# Patient Record
Sex: Female | Born: 1940 | Race: White | Hispanic: No | Marital: Single | Smoking: Never smoker
Health system: Southern US, Community
[De-identification: ages and names within clinical notes are randomized; demographics above are authoritative.]

## PROBLEM LIST (undated history)

## (undated) DIAGNOSIS — I10 Essential (primary) hypertension: Secondary | ICD-10-CM

## (undated) DIAGNOSIS — J302 Other seasonal allergic rhinitis: Secondary | ICD-10-CM

## (undated) DIAGNOSIS — R011 Cardiac murmur, unspecified: Secondary | ICD-10-CM

## (undated) DIAGNOSIS — M1990 Unspecified osteoarthritis, unspecified site: Secondary | ICD-10-CM

## (undated) DIAGNOSIS — G4730 Sleep apnea, unspecified: Secondary | ICD-10-CM

## (undated) DIAGNOSIS — H9190 Unspecified hearing loss, unspecified ear: Secondary | ICD-10-CM

## (undated) DIAGNOSIS — H8110 Benign paroxysmal vertigo, unspecified ear: Secondary | ICD-10-CM

## (undated) DIAGNOSIS — G629 Polyneuropathy, unspecified: Secondary | ICD-10-CM

## (undated) DIAGNOSIS — K449 Diaphragmatic hernia without obstruction or gangrene: Secondary | ICD-10-CM

## (undated) DIAGNOSIS — D131 Benign neoplasm of stomach: Secondary | ICD-10-CM

## (undated) DIAGNOSIS — M810 Age-related osteoporosis without current pathological fracture: Secondary | ICD-10-CM

## (undated) DIAGNOSIS — K209 Esophagitis, unspecified without bleeding: Secondary | ICD-10-CM

## (undated) DIAGNOSIS — J3089 Other allergic rhinitis: Secondary | ICD-10-CM

## (undated) DIAGNOSIS — R600 Localized edema: Secondary | ICD-10-CM

## (undated) DIAGNOSIS — K219 Gastro-esophageal reflux disease without esophagitis: Secondary | ICD-10-CM

## (undated) DIAGNOSIS — I6529 Occlusion and stenosis of unspecified carotid artery: Secondary | ICD-10-CM

## (undated) HISTORY — PX: EAR EXAMINATION UNDER ANESTHESIA: SHX1482

## (undated) HISTORY — PX: EYE SURGERY: SHX253

## (undated) HISTORY — PX: ABDOMINAL HYSTERECTOMY: SHX81

## (undated) HISTORY — PX: BREAST CYST ASPIRATION: SHX578

## (undated) HISTORY — PX: OTHER SURGICAL HISTORY: SHX169

## (undated) HISTORY — PX: INNER EAR SURGERY: SHX679

---

## 2003-10-15 ENCOUNTER — Other Ambulatory Visit: Payer: Self-pay

## 2004-05-05 ENCOUNTER — Ambulatory Visit: Payer: Self-pay | Admitting: Internal Medicine

## 2004-10-28 ENCOUNTER — Ambulatory Visit: Payer: Self-pay

## 2005-05-19 ENCOUNTER — Ambulatory Visit: Payer: Self-pay | Admitting: Internal Medicine

## 2005-09-27 ENCOUNTER — Ambulatory Visit: Payer: Self-pay | Admitting: Internal Medicine

## 2005-12-29 ENCOUNTER — Ambulatory Visit: Payer: Self-pay | Admitting: Gastroenterology

## 2006-01-11 ENCOUNTER — Ambulatory Visit: Payer: Self-pay | Admitting: Gastroenterology

## 2006-06-07 ENCOUNTER — Ambulatory Visit: Payer: Self-pay | Admitting: Internal Medicine

## 2007-07-04 ENCOUNTER — Ambulatory Visit: Payer: Self-pay | Admitting: Internal Medicine

## 2007-11-08 ENCOUNTER — Ambulatory Visit: Payer: Self-pay | Admitting: Podiatry

## 2008-07-10 ENCOUNTER — Ambulatory Visit: Payer: Self-pay | Admitting: Internal Medicine

## 2009-01-14 ENCOUNTER — Ambulatory Visit: Payer: Self-pay | Admitting: Gastroenterology

## 2009-07-14 ENCOUNTER — Ambulatory Visit: Payer: Self-pay | Admitting: Internal Medicine

## 2010-07-15 ENCOUNTER — Ambulatory Visit: Payer: Self-pay | Admitting: Internal Medicine

## 2011-07-27 ENCOUNTER — Ambulatory Visit: Payer: Self-pay | Admitting: Gastroenterology

## 2011-08-12 ENCOUNTER — Ambulatory Visit: Payer: Self-pay | Admitting: Internal Medicine

## 2011-12-18 ENCOUNTER — Emergency Department: Payer: Self-pay | Admitting: Emergency Medicine

## 2011-12-19 LAB — CBC
HCT: 36.2 % (ref 35.0–47.0)
MCH: 28.5 pg (ref 26.0–34.0)
MCHC: 33.8 g/dL (ref 32.0–36.0)
Platelet: 260 10*3/uL (ref 150–440)
RDW: 14.1 % (ref 11.5–14.5)
WBC: 6.2 10*3/uL (ref 3.6–11.0)

## 2011-12-19 LAB — COMPREHENSIVE METABOLIC PANEL
Anion Gap: 6 — ABNORMAL LOW (ref 7–16)
BUN: 16 mg/dL (ref 7–18)
Calcium, Total: 8.9 mg/dL (ref 8.5–10.1)
Co2: 33 mmol/L — ABNORMAL HIGH (ref 21–32)
EGFR (Non-African Amer.): >60
Glucose: 94 mg/dL (ref 65–99)
Osmolality: 265 (ref 275–301)
Potassium: 4 mmol/L (ref 3.5–5.1)
Sodium: 132 mmol/L — ABNORMAL LOW (ref 136–145)
Total Protein: 8.4 g/dL — ABNORMAL HIGH (ref 6.4–8.2)

## 2011-12-19 LAB — TROPONIN I: Troponin-I: <0.02 ng/mL

## 2011-12-20 ENCOUNTER — Ambulatory Visit: Payer: Self-pay | Admitting: Internal Medicine

## 2012-02-11 ENCOUNTER — Ambulatory Visit: Payer: Self-pay | Admitting: Internal Medicine

## 2012-08-18 ENCOUNTER — Ambulatory Visit: Payer: Self-pay | Admitting: Internal Medicine

## 2012-10-05 ENCOUNTER — Ambulatory Visit: Payer: Self-pay | Admitting: Internal Medicine

## 2013-03-02 ENCOUNTER — Emergency Department: Payer: Self-pay | Admitting: Emergency Medicine

## 2013-03-02 LAB — URINALYSIS, COMPLETE
Bilirubin,UR: NEGATIVE
Glucose,UR: NEGATIVE mg/dL (ref 0–75)
Ketone: NEGATIVE
Leukocyte Esterase: NEGATIVE
Ph: 7 (ref 4.5–8.0)
RBC,UR: 2 /HPF (ref 0–5)
Specific Gravity: 1.009 (ref 1.003–1.030)
Squamous Epithelial: 3
WBC UR: <1 /HPF (ref 0–5)

## 2013-03-02 LAB — LIPID PANEL
Cholesterol: 185 mg/dL (ref 0–200)
HDL Cholesterol: 102 mg/dL — ABNORMAL HIGH (ref 40–60)
Triglycerides: 67 mg/dL (ref 0–200)
VLDL Cholesterol, Calc: 13 mg/dL (ref 5–40)

## 2013-03-02 LAB — COMPREHENSIVE METABOLIC PANEL
Alkaline Phosphatase: 131 U/L (ref 50–136)
Anion Gap: 6 — ABNORMAL LOW (ref 7–16)
BUN: 15 mg/dL (ref 7–18)
Bilirubin,Total: 0.2 mg/dL (ref 0.2–1.0)
Calcium, Total: 8.3 mg/dL — ABNORMAL LOW (ref 8.5–10.1)
Chloride: 98 mmol/L (ref 98–107)
Co2: 26 mmol/L (ref 21–32)
Creatinine: 0.7 mg/dL (ref 0.60–1.30)
EGFR (African American): >60
Glucose: 107 mg/dL — ABNORMAL HIGH (ref 65–99)
Potassium: 3.8 mmol/L (ref 3.5–5.1)
SGOT(AST): 28 U/L (ref 15–37)
SGPT (ALT): 29 U/L (ref 12–78)
Sodium: 130 mmol/L — ABNORMAL LOW (ref 136–145)
Total Protein: 7.8 g/dL (ref 6.4–8.2)

## 2013-03-02 LAB — CBC WITH DIFFERENTIAL/PLATELET
Comment - H1-Com1: NORMAL
HCT: 43.9 % (ref 35.0–47.0)
HGB: 14.4 g/dL (ref 12.0–16.0)
Lymphocytes: 18 %
MCHC: 32.8 g/dL (ref 32.0–36.0)
MCV: 90 fL (ref 80–100)
Platelet: 211 10*3/uL (ref 150–440)
RBC: 4.88 10*6/uL (ref 3.80–5.20)
Segmented Neutrophils: 76 %
WBC: 8.4 10*3/uL (ref 3.6–11.0)

## 2013-03-02 LAB — TROPONIN I: Troponin-I: <0.02 ng/mL

## 2013-08-16 DIAGNOSIS — I10 Essential (primary) hypertension: Secondary | ICD-10-CM | POA: Insufficient documentation

## 2013-08-21 ENCOUNTER — Ambulatory Visit: Payer: Self-pay | Admitting: Internal Medicine

## 2013-12-26 ENCOUNTER — Ambulatory Visit: Payer: Self-pay | Admitting: Internal Medicine

## 2014-04-12 DIAGNOSIS — G5793 Unspecified mononeuropathy of bilateral lower limbs: Secondary | ICD-10-CM | POA: Insufficient documentation

## 2014-04-12 DIAGNOSIS — J302 Other seasonal allergic rhinitis: Secondary | ICD-10-CM | POA: Insufficient documentation

## 2014-04-12 DIAGNOSIS — Z8669 Personal history of other diseases of the nervous system and sense organs: Secondary | ICD-10-CM | POA: Insufficient documentation

## 2014-04-23 DIAGNOSIS — E782 Mixed hyperlipidemia: Secondary | ICD-10-CM | POA: Insufficient documentation

## 2014-08-12 DIAGNOSIS — M159 Polyosteoarthritis, unspecified: Secondary | ICD-10-CM | POA: Insufficient documentation

## 2014-08-12 DIAGNOSIS — M791 Myalgia, unspecified site: Secondary | ICD-10-CM | POA: Insufficient documentation

## 2014-08-23 ENCOUNTER — Ambulatory Visit: Payer: Self-pay | Admitting: Internal Medicine

## 2014-11-27 ENCOUNTER — Other Ambulatory Visit: Payer: Self-pay | Admitting: Unknown Physician Specialty

## 2014-11-27 DIAGNOSIS — H908 Mixed conductive and sensorineural hearing loss, unspecified: Secondary | ICD-10-CM

## 2014-12-06 ENCOUNTER — Ambulatory Visit
Admission: RE | Admit: 2014-12-06 | Discharge: 2014-12-06 | Disposition: A | Discharge status: Home / Self Care | Payer: BLUE CROSS/BLUE SHIELD | Source: Ambulatory Visit | Attending: Unknown Physician Specialty | Admitting: Unknown Physician Specialty

## 2014-12-06 DIAGNOSIS — H908 Mixed conductive and sensorineural hearing loss, unspecified: Secondary | ICD-10-CM | POA: Insufficient documentation

## 2014-12-06 DIAGNOSIS — H903 Sensorineural hearing loss, bilateral: Secondary | ICD-10-CM | POA: Diagnosis present

## 2014-12-06 DIAGNOSIS — J329 Chronic sinusitis, unspecified: Secondary | ICD-10-CM | POA: Diagnosis not present

## 2015-01-27 DIAGNOSIS — R9389 Abnormal findings on diagnostic imaging of other specified body structures: Secondary | ICD-10-CM | POA: Insufficient documentation

## 2015-08-15 ENCOUNTER — Other Ambulatory Visit: Payer: Self-pay | Admitting: Internal Medicine

## 2015-08-15 DIAGNOSIS — Z1231 Encounter for screening mammogram for malignant neoplasm of breast: Secondary | ICD-10-CM

## 2015-08-25 ENCOUNTER — Ambulatory Visit
Admission: RE | Admit: 2015-08-25 | Discharge: 2015-08-25 | Disposition: A | Discharge status: Home / Self Care | Payer: 59 | Source: Ambulatory Visit | Attending: Internal Medicine | Admitting: Internal Medicine

## 2015-08-25 DIAGNOSIS — Z1231 Encounter for screening mammogram for malignant neoplasm of breast: Secondary | ICD-10-CM

## 2016-03-30 DIAGNOSIS — R32 Unspecified urinary incontinence: Secondary | ICD-10-CM | POA: Insufficient documentation

## 2016-03-30 DIAGNOSIS — K2100 Gastro-esophageal reflux disease with esophagitis, without bleeding: Secondary | ICD-10-CM | POA: Insufficient documentation

## 2016-06-18 ENCOUNTER — Ambulatory Visit
Admission: RE | Admit: 2016-06-18 | Discharge: 2016-06-18 | Disposition: A | Discharge status: Home / Self Care | Payer: 59 | Source: Ambulatory Visit | Attending: Gastroenterology | Admitting: Gastroenterology

## 2016-06-18 ENCOUNTER — Ambulatory Visit: Payer: 59 | Admitting: Anesthesiology

## 2016-06-18 ENCOUNTER — Encounter
Admission: RE | Disposition: A | Discharge status: Home / Self Care | Payer: Self-pay | Source: Ambulatory Visit | Attending: Gastroenterology

## 2016-06-18 ENCOUNTER — Encounter: Payer: Self-pay | Admitting: *Deleted

## 2016-06-18 DIAGNOSIS — K317 Polyp of stomach and duodenum: Secondary | ICD-10-CM | POA: Insufficient documentation

## 2016-06-18 DIAGNOSIS — Z86010 Personal history of colonic polyps: Secondary | ICD-10-CM | POA: Diagnosis not present

## 2016-06-18 DIAGNOSIS — K228 Other specified diseases of esophagus: Secondary | ICD-10-CM | POA: Insufficient documentation

## 2016-06-18 DIAGNOSIS — K2950 Unspecified chronic gastritis without bleeding: Secondary | ICD-10-CM | POA: Diagnosis not present

## 2016-06-18 DIAGNOSIS — K2970 Gastritis, unspecified, without bleeding: Secondary | ICD-10-CM | POA: Insufficient documentation

## 2016-06-18 DIAGNOSIS — Z1211 Encounter for screening for malignant neoplasm of colon: Secondary | ICD-10-CM | POA: Diagnosis not present

## 2016-06-18 DIAGNOSIS — Z888 Allergy status to other drugs, medicaments and biological substances status: Secondary | ICD-10-CM | POA: Diagnosis not present

## 2016-06-18 DIAGNOSIS — I10 Essential (primary) hypertension: Secondary | ICD-10-CM | POA: Insufficient documentation

## 2016-06-18 DIAGNOSIS — Q438 Other specified congenital malformations of intestine: Secondary | ICD-10-CM | POA: Diagnosis not present

## 2016-06-18 DIAGNOSIS — K210 Gastro-esophageal reflux disease with esophagitis: Secondary | ICD-10-CM | POA: Insufficient documentation

## 2016-06-18 DIAGNOSIS — G4730 Sleep apnea, unspecified: Secondary | ICD-10-CM | POA: Diagnosis not present

## 2016-06-18 DIAGNOSIS — Z886 Allergy status to analgesic agent status: Secondary | ICD-10-CM | POA: Insufficient documentation

## 2016-06-18 DIAGNOSIS — Z882 Allergy status to sulfonamides status: Secondary | ICD-10-CM | POA: Diagnosis not present

## 2016-06-18 HISTORY — PX: ESOPHAGOGASTRODUODENOSCOPY (EGD) WITH PROPOFOL: SHX5813

## 2016-06-18 HISTORY — PX: COLONOSCOPY WITH PROPOFOL: SHX5780

## 2016-06-18 SURGERY — COLONOSCOPY WITH PROPOFOL
Anesthesia: General

## 2016-06-18 MED ORDER — SODIUM CHLORIDE 0.9 % IV SOLN: INTRAVENOUS | Status: DC

## 2016-06-18 MED ORDER — PHENYLEPHRINE 40 MCG/ML (10ML) SYRINGE FOR IV PUSH (FOR BLOOD PRESSURE SUPPORT)
PREFILLED_SYRINGE | INTRAVENOUS | Status: AC
  Filled 2016-06-18: qty 10

## 2016-06-18 MED ORDER — PROPOFOL 500 MG/50ML IV EMUL
INTRAVENOUS | Status: DC | PRN
  Administered 2016-06-18: 150 ug/kg/min via INTRAVENOUS

## 2016-06-18 MED ORDER — MIDAZOLAM HCL 2 MG/2ML IJ SOLN
INTRAMUSCULAR | Status: DC | PRN
  Administered 2016-06-18: 1 mg via INTRAVENOUS

## 2016-06-18 MED ORDER — LIDOCAINE HCL (PF) 2 % IJ SOLN
INTRAMUSCULAR | Status: AC
  Filled 2016-06-18: qty 2

## 2016-06-18 MED ORDER — SODIUM CHLORIDE 0.9 % IV SOLN
INTRAVENOUS | Status: DC
  Administered 2016-06-18: 10:00:00 via INTRAVENOUS
  Administered 2016-06-18: 1000 mL via INTRAVENOUS

## 2016-06-18 MED ORDER — PHENYLEPHRINE HCL 10 MG/ML IJ SOLN
INTRAMUSCULAR | Status: DC | PRN
  Administered 2016-06-18 (×3): 120 ug via INTRAVENOUS
  Administered 2016-06-18: 40 ug via INTRAVENOUS

## 2016-06-18 MED ORDER — LIDOCAINE HCL (CARDIAC) 20 MG/ML IV SOLN
INTRAVENOUS | Status: DC | PRN
  Administered 2016-06-18: 60 mg via INTRAVENOUS

## 2016-06-18 MED ORDER — PROPOFOL 500 MG/50ML IV EMUL
INTRAVENOUS | Status: AC
  Filled 2016-06-18: qty 50

## 2016-06-18 MED ORDER — PROPOFOL 10 MG/ML IV BOLUS
INTRAVENOUS | Status: DC | PRN
  Administered 2016-06-18: 50 mg via INTRAVENOUS

## 2016-06-18 MED ORDER — PROPOFOL 10 MG/ML IV BOLUS
INTRAVENOUS | Status: AC
Start: 2016-06-18 — End: 2016-06-18
  Filled 2016-06-18: qty 20

## 2016-06-18 MED ORDER — MIDAZOLAM HCL 2 MG/2ML IJ SOLN
INTRAMUSCULAR | Status: AC
  Filled 2016-06-18: qty 2

## 2016-06-18 NOTE — Anesthesia Postprocedure Evaluation (Signed)
Anesthesia Post Note  Patient: Seraphim Mangelsdorf Misch  Procedure(s) Performed: Procedure(s) (LRB): COLONOSCOPY WITH PROPOFOL (N/A) ESOPHAGOGASTRODUODENOSCOPY (EGD) WITH PROPOFOL (N/A)  Patient location during evaluation: Endoscopy Anesthesia Type: General Level of consciousness: awake and alert Pain management: pain level controlled Vital Signs Assessment: post-procedure vital signs reviewed and stable Respiratory status: spontaneous breathing, nonlabored ventilation, respiratory function stable and patient connected to nasal cannula oxygen Cardiovascular status: blood pressure returned to baseline and stable Postop Assessment: no signs of nausea or vomiting Anesthetic complications: no     Last Vitals:  Vitals:   06/18/16 1050 06/18/16 1100  BP: (!) 118/58 122/67  Pulse: 76 65  Resp: 16 18  Temp:      Last Pain:  Vitals:   06/18/16 1020  TempSrc: Tympanic                 Precious Haws Tadhg Eskew

## 2016-06-18 NOTE — Anesthesia Post-op Follow-up Note (Cosign Needed)
Anesthesia QCDR form completed.        

## 2016-06-18 NOTE — H&P (Signed)
Outpatient short stay form Pre-procedure 06/18/2016 7:55 AM Lollie Sails MD  Primary Physician: Dr Tracie Harrier  Reason for visit:  EGD and colonoscopy  History of present illness:  Patient is a 76 year old female presenting today as above. She is been having some increased symptoms of GERD recently this sounds a bit perhaps also like esophageal dysmotility. She also has a personal history of adenomatous colon polyps. She tolerated her prep well. She takes no aspirin or blood thinning agents.    Current Facility-Administered Medications:  .  0.9 %  sodium chloride infusion, , Intravenous, Continuous, Lollie Sails, MD, Last Rate: 20 mL/hr at 06/18/16 0752, 1,000 mL at 06/18/16 0752 .  0.9 %  sodium chloride infusion, , Intravenous, Continuous, Lollie Sails, MD  Prescriptions Prior to Admission  Medication Sig Dispense Refill Last Dose  . acetaminophen (TYLENOL) 500 MG chewable tablet Chew 500 mg by mouth every 6 (six) hours as needed for pain.   Past Week at Unknown time  . calcium carbonate (OS-CAL) 600 MG TABS tablet Take 600 mg by mouth 2 (two) times daily with a meal.   Past Week at Unknown time  . Clindamycin Phosphate foam Apply topically 2 (two) times daily.   06/17/2016 at Unknown time  . clotrimazole-betamethasone (LOTRISONE) cream Apply 1 application topically 2 (two) times daily.   06/17/2016 at Unknown time  . cyanocobalamin 1000 MCG tablet Take 1,000 mcg by mouth daily.   06/17/2016 at Unknown time  . fluticasone (FLONASE) 50 MCG/ACT nasal spray Place into both nostrils daily.   06/17/2016 at Unknown time  . gabapentin (NEURONTIN) 100 MG capsule Take 100 mg by mouth 3 (three) times daily.   06/17/2016 at Unknown time  . hydrochlorothiazide (MICROZIDE) 12.5 MG capsule Take 12.5 mg by mouth daily.   06/18/2016 at 0530  . loratadine (CLARITIN) 10 MG tablet Take 10 mg by mouth daily.   06/17/2016 at Unknown time  . losartan (COZAAR) 100 MG tablet Take 100 mg by mouth  daily.   06/18/2016 at 0530  . metoprolol (LOPRESSOR) 100 MG tablet Take 100 mg by mouth 2 (two) times daily.   06/18/2016 at 0530  . omeprazole (PRILOSEC) 20 MG capsule Take 20 mg by mouth daily.   06/17/2016 at Unknown time  . spironolactone (ALDACTONE) 25 MG tablet Take 25 mg by mouth daily.   06/17/2016 at Unknown time     Allergies  Allergen Reactions  . Aspirin   . Augmentin [Amoxicillin-Pot Clavulanate] Diarrhea  . Compazine [Prochlorperazine Edisylate]   . Ibuprofen   . Triamterene   . Sulfa Antibiotics Rash     No past medical history on file.  Review of systems:      Physical Exam    Heart and lungs: Regular rate and rhythm without rub or gallop, lungs are bilaterally clear.    HEENT: Normocephalic atraumatic eyes are anicteric    Other:     Pertinant exam for procedure: Soft nontender nondistended bowel sounds positive normoactive.    Planned proceedures: EGD and Colonoscopy and indicated procedures. I have discussed the risks benefits and complications of procedures to include not limited to bleeding, infection, perforation and the risk of sedation and the patient wishes to proceed.    Lollie Sails, MD Gastroenterology 06/18/2016  7:55 AM

## 2016-06-18 NOTE — Op Note (Signed)
[facility] Gastroenterology Patient Name: Kaitlyn Nelson Procedure Date: 06/18/2016 9:33 AM MRN: [id number] Account #: [id number] Date of Birth: 12/23/1940 Admit Type: Outpatient Age: 76 Room: [facility] ENDO ROOM 3 Gender: Female Note Status: Finalized Procedure:            Colonoscopy Indications:          Personal history of colonic polyps Providers:            Lollie Sails, MD Referring MD:         Tracie Harrier, MD (Referring MD) Medicines:            Monitored Anesthesia Care Complications:        No immediate complications. Procedure:            Pre-Anesthesia Assessment:                       - ASA Grade Assessment: III - A patient with severe                        systemic disease.                       After obtaining informed consent, the colonoscope was                        passed under direct vision. Throughout the procedure,                        the patient's blood pressure, pulse, and oxygen                        saturations were monitored continuously. The                        Colonoscope was introduced through the anus and                        advanced to the the cecum, identified by appendiceal                        orifice and ileocecal valve. The colonoscopy was                        performed with moderate difficulty due to significant                        looping and a tortuous colon. Successful completion of                        the procedure was aided by using manual pressure. The                        quality of the bowel preparation was good. Findings:      The sigmoid colon, descending colon, transverse colon and ascending       colon were significantly tortuous.      The digital rectal exam was normal.      The retroflexed view of the distal rectum and anal verge was normal and       showed no anal or rectal abnormalities.      Biopsies for histology were  taken with a cold forceps from the right       colon and  left colon for evaluation of microscopic colitis. Impression:           - Tortuous colon.                       - The distal rectum and anal verge are normal on                        retroflexion view.                       - Biopsies were taken with a cold forceps from the                        right colon and left colon for evaluation of                        microscopic colitis. Recommendation:       - Discharge patient to home.                       - Return to GI clinic in 4 weeks. Procedure Code(s):    --- Professional ---                       [id number], Colonoscopy, flexible; with biopsy, single or                        multiple Diagnosis Code(s):    --- Professional ---                       Z86.010, Personal history of colonic polyps                       Q43.8, Other specified congenital malformations of                        intestine CPT copyright 2016 American Medical Association. All rights reserved. The codes documented in this report are preliminary and upon coder review may  be revised to meet current compliance requirements. Lollie Sails, MD 06/18/2016 10:21:00 AM This report has been signed electronically. Number of Addenda: 0 Note Initiated On: 06/18/2016 9:33 AM Scope Withdrawal Time: 0 hours 6 minutes 55 seconds  Total Procedure Duration: 0 hours 17 minutes 50 seconds       [facility]

## 2016-06-18 NOTE — Transfer of Care (Signed)
Immediate Anesthesia Transfer of Care Note  Patient: Denine Plouff Sanks  Procedure(s) Performed: Procedure(s): COLONOSCOPY WITH PROPOFOL (N/A) ESOPHAGOGASTRODUODENOSCOPY (EGD) WITH PROPOFOL (N/A)  Patient Location: Endoscopy Unit  Anesthesia Type:General  Level of Consciousness: awake, oriented and patient cooperative  Airway & Oxygen Therapy: Patient Spontanous Breathing and Patient connected to nasal cannula oxygen  Post-op Assessment: Report given to RN, Post -op Vital signs reviewed and stable and Patient moving all extremities X 4  Post vital signs: Reviewed and stable  Last Vitals:  Vitals:   06/18/16 0740  BP: (!) 159/59  Pulse: 60  Resp: 16  Temp: (!) 35.3 C    Last Pain:  Vitals:   06/18/16 0740  TempSrc: Tympanic         Complications: No apparent anesthesia complications

## 2016-06-18 NOTE — Anesthesia Preprocedure Evaluation (Signed)
Anesthesia Evaluation  Patient identified by MRN, date of birth, ID band Patient awake    Reviewed: Allergy & Precautions, H&P , NPO status , Patient's Chart, lab work & pertinent test results  History of Anesthesia Complications Negative for: history of anesthetic complications  Airway Mallampati: III  TM Distance: <3 FB Neck ROM: limited    Dental  (+) Poor Dentition, Chipped, Caps   Pulmonary neg shortness of breath, sleep apnea ,    Pulmonary exam normal breath sounds clear to auscultation       Cardiovascular Exercise Tolerance: Good hypertension, (-) angina(-) Past MI and (-) DOE Normal cardiovascular exam Rhythm:regular Rate:Normal     Neuro/Psych negative neurological ROS  negative psych ROS   GI/Hepatic Neg liver ROS, GERD  Controlled and Medicated,  Endo/Other  negative endocrine ROS  Renal/GU negative Renal ROS  negative genitourinary   Musculoskeletal   Abdominal   Peds  Hematology negative hematology ROS (+)   Anesthesia Other Findings  Past Surgical History: No date: BREAST CYST ASPIRATION Right     Comment: neg  BMI    Body Mass Index:  33.73 kg/m      Reproductive/Obstetrics negative OB ROS                             Anesthesia Physical Anesthesia Plan  ASA: III  Anesthesia Plan: General   Post-op Pain Management:    Induction:   Airway Management Planned:   Additional Equipment:   Intra-op Plan:   Post-operative Plan:   Informed Consent: I have reviewed the patients History and Physical, chart, labs and discussed the procedure including the risks, benefits and alternatives for the proposed anesthesia with the patient or authorized representative who has indicated his/her understanding and acceptance.   Dental Advisory Given  Plan Discussed with: Anesthesiologist, CRNA and Surgeon  Anesthesia Plan Comments:         Anesthesia Quick  Evaluation

## 2016-06-18 NOTE — Op Note (Signed)
[facility] Gastroenterology Patient Name: Kaitlyn Nelson Procedure Date: 06/18/2016 9:34 AM MRN: [id number] Account #: [id number] Date of Birth: February 13, 1941 Admit Type: Outpatient Age: 76 Room: [facility] ENDO ROOM 3 Gender: Female Note Status: Finalized Procedure:            Upper GI endoscopy Indications:          Dyspepsia, Gastro-esophageal reflux disease Providers:            Lollie Sails, MD Referring MD:         Tracie Harrier, MD (Referring MD) Medicines:            Monitored Anesthesia Care Complications:        No immediate complications. Procedure:            Pre-Anesthesia Assessment:                       - ASA Grade Assessment: III - A patient with severe                        systemic disease.                       After obtaining informed consent, the endoscope was                        passed under direct vision. Throughout the procedure,                        the patient's blood pressure, pulse, and oxygen                        saturations were monitored continuously. The Endoscope                        was introduced through the mouth, and advanced to the                        third part of duodenum. The total duration of the                        procedure was [Duration]. The upper GI endoscopy was                        accomplished without difficulty. The patient tolerated                        the procedure well. Findings:      The Z-line was variable. Biopsies were taken with a cold forceps for       histology.      Multiple 1 to 3 mm pedunculated and sessile polyps with no bleeding and       no stigmata of recent bleeding were found in the gastric body. Biopsies       were taken with a cold forceps for histology.      A single 4 mm submucosal/mucosal papule (nodule) with no bleeding and no       stigmata of recent bleeding was found on the greater curvature of the       gastric antrum. Biopsies were taken with a cold forceps  for histology.      Diffuse minimal inflammation characterized  by erythema was found in the       gastric body. Biopsies were taken with a cold forceps for histology.      The cardia and gastric fundus were normal on retroflexion. Unable to       insufflate adequately to evaluate fundus fully.      The examined duodenum was normal.      A small hiatal hernia was found. The Z-line was a variable distance from       incisors; the hiatal hernia was sliding. Impression:           - Z-line variable. Biopsied.                       - Multiple gastric polyps. Biopsied.                       - A single submucosal papule (nodule) found in the                        stomach. Biopsied.                       - Gastritis. Biopsied.                       - Normal examined duodenum. Recommendation:       - Discharge patient to home.                       - Perform a colonoscopy today.                       - Continue present medications.                       - Await pathology results.                       - Do an upper GI series at appointment to be scheduled. Procedure Code(s):    --- Professional ---                       [phone], Esophagogastroduodenoscopy, flexible, transoral;                        with biopsy, single or multiple Diagnosis Code(s):    --- Professional ---                       K22.8, Other specified diseases of esophagus                       K31.7, Polyp of stomach and duodenum                       K31.89, Other diseases of stomach and duodenum                       K29.70, Gastritis, unspecified, without bleeding                       R10.13, Epigastric pain                       K21.9, Gastro-esophageal reflux disease without  esophagitis CPT copyright 2016 American Medical Association. All rights reserved. The codes documented in this report are preliminary and upon coder review may  be revised to meet current compliance requirements. Lollie Sails, MD 06/18/2016 9:56:53 AM This report has been signed electronically. Number of Addenda: 0 Note Initiated On: 06/18/2016 9:34 AM      [facility]

## 2016-06-21 ENCOUNTER — Encounter: Payer: Self-pay | Admitting: Gastroenterology

## 2016-06-21 LAB — SURGICAL PATHOLOGY

## 2016-07-28 ENCOUNTER — Other Ambulatory Visit: Payer: Self-pay | Admitting: Internal Medicine

## 2016-07-28 DIAGNOSIS — Z1231 Encounter for screening mammogram for malignant neoplasm of breast: Secondary | ICD-10-CM

## 2016-08-26 ENCOUNTER — Ambulatory Visit
Admission: RE | Admit: 2016-08-26 | Discharge: 2016-08-26 | Disposition: A | Discharge status: Home / Self Care | Payer: 59 | Source: Ambulatory Visit | Attending: Internal Medicine | Admitting: Internal Medicine

## 2016-08-26 DIAGNOSIS — Z1231 Encounter for screening mammogram for malignant neoplasm of breast: Secondary | ICD-10-CM | POA: Insufficient documentation

## 2016-08-26 DIAGNOSIS — R928 Other abnormal and inconclusive findings on diagnostic imaging of breast: Secondary | ICD-10-CM | POA: Insufficient documentation

## 2016-08-31 ENCOUNTER — Other Ambulatory Visit: Payer: Self-pay | Admitting: Internal Medicine

## 2016-08-31 DIAGNOSIS — N6489 Other specified disorders of breast: Secondary | ICD-10-CM

## 2016-08-31 DIAGNOSIS — R928 Other abnormal and inconclusive findings on diagnostic imaging of breast: Secondary | ICD-10-CM

## 2016-09-07 ENCOUNTER — Ambulatory Visit
Admission: RE | Admit: 2016-09-07 | Discharge: 2016-09-07 | Disposition: A | Discharge status: Home / Self Care | Payer: 59 | Source: Ambulatory Visit | Attending: Internal Medicine | Admitting: Internal Medicine

## 2016-09-07 DIAGNOSIS — N6489 Other specified disorders of breast: Secondary | ICD-10-CM

## 2016-09-07 DIAGNOSIS — R928 Other abnormal and inconclusive findings on diagnostic imaging of breast: Secondary | ICD-10-CM

## 2017-08-01 ENCOUNTER — Other Ambulatory Visit: Payer: Self-pay | Admitting: Internal Medicine

## 2017-08-01 DIAGNOSIS — Z1239 Encounter for other screening for malignant neoplasm of breast: Secondary | ICD-10-CM

## 2017-10-03 ENCOUNTER — Ambulatory Visit
Admission: RE | Admit: 2017-10-03 | Discharge: 2017-10-03 | Disposition: A | Discharge status: Home / Self Care | Payer: 59 | Source: Ambulatory Visit | Attending: Internal Medicine | Admitting: Internal Medicine

## 2017-10-03 DIAGNOSIS — Z1231 Encounter for screening mammogram for malignant neoplasm of breast: Secondary | ICD-10-CM | POA: Insufficient documentation

## 2017-10-03 DIAGNOSIS — Z1239 Encounter for other screening for malignant neoplasm of breast: Secondary | ICD-10-CM

## 2018-06-19 ENCOUNTER — Encounter: Payer: Self-pay | Admitting: *Deleted

## 2018-06-20 ENCOUNTER — Ambulatory Visit: Payer: 59 | Admitting: Anesthesiology

## 2018-06-20 ENCOUNTER — Ambulatory Visit
Admission: RE | Admit: 2018-06-20 | Discharge: 2018-06-20 | Disposition: A | Discharge status: Home / Self Care | Payer: 59 | Attending: Ophthalmology | Admitting: Ophthalmology

## 2018-06-20 ENCOUNTER — Other Ambulatory Visit: Payer: Self-pay

## 2018-06-20 ENCOUNTER — Encounter
Admission: RE | Disposition: A | Discharge status: Home / Self Care | Payer: Self-pay | Source: Home / Self Care | Attending: Ophthalmology

## 2018-06-20 ENCOUNTER — Encounter: Payer: Self-pay | Admitting: *Deleted

## 2018-06-20 DIAGNOSIS — Z79899 Other long term (current) drug therapy: Secondary | ICD-10-CM | POA: Diagnosis not present

## 2018-06-20 DIAGNOSIS — K219 Gastro-esophageal reflux disease without esophagitis: Secondary | ICD-10-CM | POA: Insufficient documentation

## 2018-06-20 DIAGNOSIS — G4730 Sleep apnea, unspecified: Secondary | ICD-10-CM | POA: Insufficient documentation

## 2018-06-20 DIAGNOSIS — H2512 Age-related nuclear cataract, left eye: Secondary | ICD-10-CM | POA: Insufficient documentation

## 2018-06-20 DIAGNOSIS — I10 Essential (primary) hypertension: Secondary | ICD-10-CM | POA: Diagnosis not present

## 2018-06-20 HISTORY — DX: Sleep apnea, unspecified: G47.30

## 2018-06-20 HISTORY — DX: Polyneuropathy, unspecified: G62.9

## 2018-06-20 HISTORY — DX: Age-related osteoporosis without current pathological fracture: M81.0

## 2018-06-20 HISTORY — DX: Diaphragmatic hernia without obstruction or gangrene: K44.9

## 2018-06-20 HISTORY — DX: Gastro-esophageal reflux disease without esophagitis: K21.9

## 2018-06-20 HISTORY — DX: Occlusion and stenosis of unspecified carotid artery: I65.29

## 2018-06-20 HISTORY — DX: Other allergic rhinitis: J30.89

## 2018-06-20 HISTORY — DX: Localized edema: R60.0

## 2018-06-20 HISTORY — PX: CATARACT EXTRACTION W/PHACO: SHX586

## 2018-06-20 HISTORY — DX: Essential (primary) hypertension: I10

## 2018-06-20 HISTORY — DX: Unspecified hearing loss, unspecified ear: H91.90

## 2018-06-20 HISTORY — DX: Unspecified osteoarthritis, unspecified site: M19.90

## 2018-06-20 SURGERY — PHACOEMULSIFICATION, CATARACT, WITH IOL INSERTION
Anesthesia: Monitor Anesthesia Care | Site: Eye | Laterality: Left

## 2018-06-20 MED ORDER — FENTANYL CITRATE (PF) 100 MCG/2ML IJ SOLN
INTRAMUSCULAR | Status: AC
  Filled 2018-06-20: qty 2

## 2018-06-20 MED ORDER — SODIUM CHLORIDE 0.9 % IV SOLN
INTRAVENOUS | Status: DC
  Administered 2018-06-20: 12:00:00 via INTRAVENOUS

## 2018-06-20 MED ORDER — LIDOCAINE HCL (PF) 4 % IJ SOLN
INTRAMUSCULAR | Status: AC
  Filled 2018-06-20: qty 5

## 2018-06-20 MED ORDER — MOXIFLOXACIN HCL 0.5 % OP SOLN
OPHTHALMIC | Status: AC
  Administered 2018-06-20: 1 [drp] via OPHTHALMIC
  Filled 2018-06-20: qty 6

## 2018-06-20 MED ORDER — NA HYALUR & NA CHOND-NA HYALUR 0.55-0.5 ML IO KIT
PACK | INTRAOCULAR | Status: AC
  Filled 2018-06-20: qty 1.05

## 2018-06-20 MED ORDER — MOXIFLOXACIN HCL 0.5 % OP SOLN: 1.0000 [drp] | OPHTHALMIC | Status: DC | PRN

## 2018-06-20 MED ORDER — FENTANYL CITRATE (PF) 100 MCG/2ML IJ SOLN
INTRAMUSCULAR | Status: DC | PRN
  Administered 2018-06-20: 25 ug via INTRAVENOUS

## 2018-06-20 MED ORDER — EPINEPHRINE PF 1 MG/ML IJ SOLN
INTRAOCULAR | Status: DC | PRN
  Administered 2018-06-20: 12:00:00 via OPHTHALMIC

## 2018-06-20 MED ORDER — EPINEPHRINE PF 1 MG/ML IJ SOLN
INTRAMUSCULAR | Status: AC
  Filled 2018-06-20: qty 2

## 2018-06-20 MED ORDER — NA HYALUR & NA CHOND-NA HYALUR 0.4-0.35 ML IO KIT
PACK | INTRAOCULAR | Status: DC | PRN
  Administered 2018-06-20: .35 mL via INTRAOCULAR

## 2018-06-20 MED ORDER — TETRACAINE HCL 0.5 % OP SOLN
OPHTHALMIC | Status: AC
  Administered 2018-06-20: 1 [drp] via OPHTHALMIC
  Filled 2018-06-20: qty 4

## 2018-06-20 MED ORDER — NEOMYCIN-POLYMYXIN-DEXAMETH 3.5-10000-0.1 OP OINT
TOPICAL_OINTMENT | OPHTHALMIC | Status: AC
  Filled 2018-06-20: qty 3.5

## 2018-06-20 MED ORDER — NEOMYCIN-POLYMYXIN-DEXAMETH 0.1 % OP OINT
TOPICAL_OINTMENT | OPHTHALMIC | Status: DC | PRN
  Administered 2018-06-20: 1 via OPHTHALMIC

## 2018-06-20 MED ORDER — CARBACHOL 0.01 % IO SOLN
INTRAOCULAR | Status: DC | PRN
  Administered 2018-06-20: 0.5 mL via INTRAOCULAR

## 2018-06-20 MED ORDER — MOXIFLOXACIN HCL 0.5 % OP SOLN
1.0000 [drp] | OPHTHALMIC | Status: AC
  Administered 2018-06-20 (×3): 1 [drp] via OPHTHALMIC

## 2018-06-20 MED ORDER — ARMC OPHTHALMIC DILATING DROPS
OPHTHALMIC | Status: AC
  Administered 2018-06-20: 1 via OPHTHALMIC
  Filled 2018-06-20: qty 0.5

## 2018-06-20 MED ORDER — TETRACAINE HCL 0.5 % OP SOLN
1.0000 [drp] | OPHTHALMIC | Status: AC
  Administered 2018-06-20 (×3): 1 [drp] via OPHTHALMIC

## 2018-06-20 MED ORDER — LIDOCAINE HCL (PF) 4 % IJ SOLN
INTRAOCULAR | Status: DC | PRN
  Administered 2018-06-20: 4 mL via OPHTHALMIC

## 2018-06-20 MED ORDER — POVIDONE-IODINE 5 % OP SOLN
OPHTHALMIC | Status: AC
  Filled 2018-06-20: qty 30

## 2018-06-20 MED ORDER — MIDAZOLAM HCL 2 MG/2ML IJ SOLN
INTRAMUSCULAR | Status: DC | PRN
  Administered 2018-06-20: 1 mg via INTRAVENOUS

## 2018-06-20 MED ORDER — POVIDONE-IODINE 5 % OP SOLN
OPHTHALMIC | Status: DC | PRN
  Administered 2018-06-20: 1 via OPHTHALMIC

## 2018-06-20 MED ORDER — ARMC OPHTHALMIC DILATING DROPS
1.0000 "application " | OPHTHALMIC | Status: AC
  Administered 2018-06-20 (×3): 1 via OPHTHALMIC

## 2018-06-20 MED ORDER — MIDAZOLAM HCL 2 MG/2ML IJ SOLN
INTRAMUSCULAR | Status: AC
  Filled 2018-06-20: qty 2

## 2018-06-20 SURGICAL SUPPLY — 18 items
GLOVE BIO SURGEON STRL SZ8 (GLOVE) ×2 IMPLANT
GLOVE BIOGEL M 6.5 STRL (GLOVE) ×2 IMPLANT
GLOVE SURG LX 7.5 STRW (GLOVE) ×1
GLOVE SURG LX STRL 7.5 STRW (GLOVE) ×1 IMPLANT
GOWN STRL REUS W/ TWL LRG LVL3 (GOWN DISPOSABLE) ×2 IMPLANT
GOWN STRL REUS W/TWL LRG LVL3 (GOWN DISPOSABLE) ×2
LABEL CATARACT MEDS ST (LABEL) ×2 IMPLANT
LENS IOL TECNIS ITEC 16.0 (Intraocular Lens) ×2 IMPLANT
NDL HPO THNWL 1X22GA REG BVL (NEEDLE) ×1 IMPLANT
NEEDLE SAFETY 22GX1 (NEEDLE) ×1
PACK CATARACT (MISCELLANEOUS) ×2 IMPLANT
PACK CATARACT BRASINGTON LX (MISCELLANEOUS) ×2 IMPLANT
PACK EYE AFTER SURG (MISCELLANEOUS) ×2 IMPLANT
SOL BSS BAG (MISCELLANEOUS) ×2
SOLUTION BSS BAG (MISCELLANEOUS) ×1 IMPLANT
SYR 5ML LL (SYRINGE) ×2 IMPLANT
WATER STERILE IRR 250ML POUR (IV SOLUTION) ×2 IMPLANT
WIPE NON LINTING 3.25X3.25 (MISCELLANEOUS) ×2 IMPLANT

## 2018-06-20 NOTE — Anesthesia Postprocedure Evaluation (Signed)
Anesthesia Post Note  Patient: Chatara Lucente Cleaver  Procedure(s) Performed: CATARACT EXTRACTION PHACO AND INTRAOCULAR LENS PLACEMENT ([place]) LEFT (Left Eye)  Patient location during evaluation: PACU Anesthesia Type: MAC Level of consciousness: awake and alert and oriented Pain management: pain level controlled Vital Signs Assessment: post-procedure vital signs reviewed and stable Respiratory status: spontaneous breathing, nonlabored ventilation and respiratory function stable Cardiovascular status: blood pressure returned to baseline and stable Postop Assessment: no signs of nausea or vomiting Anesthetic complications: no     Last Vitals:  Vitals:   06/20/18 1007 06/20/18 1209  BP: 126/74 (!) 106/48  Pulse: 61 (!) 58  Resp: 14 16  Temp: (!) 35.8 C 36.6 C  SpO2: 99% 98%    Last Pain:  Vitals:   06/20/18 1209  TempSrc: Temporal  PainSc: 0-No pain                 Henri Baumler

## 2018-06-20 NOTE — Transfer of Care (Signed)
Immediate Anesthesia Transfer of Care Note  Patient: Kaitlyn Nelson  Procedure(s) Performed: CATARACT EXTRACTION PHACO AND INTRAOCULAR LENS PLACEMENT ([facility]) LEFT (Left Eye)  Patient Location: PACU  Anesthesia Type:MAC  Level of Consciousness: awake, alert  and oriented  Airway & Oxygen Therapy: Patient Spontanous Breathing  Post-op Assessment: Report given to RN and Post -op Vital signs reviewed and stable  Post vital signs: Reviewed and stable  Last Vitals:  Vitals Value Taken Time  BP 106/48 06/20/2018 12:09 PM  Temp 36.6 C 06/20/2018 12:09 PM  Pulse 58 06/20/2018 12:09 PM  Resp 16 06/20/2018 12:09 PM  SpO2 98 % 06/20/2018 12:09 PM    Last Pain:  Vitals:   06/20/18 1209  TempSrc: Temporal  PainSc: 0-No pain         Complications: No apparent anesthesia complications

## 2018-06-20 NOTE — Anesthesia Post-op Follow-up Note (Signed)
Anesthesia QCDR form completed.        

## 2018-06-20 NOTE — Discharge Instructions (Signed)
Eye Surgery Discharge Instructions    Expect mild scratchy sensation or mild soreness. DO NOT RUB YOUR EYE!  The day of surgery:  Minimal physical activity, but bed rest is not required  No reading, computer work, or close hand work  No bending, lifting, or straining.  May watch TV  For 24 hours:  No driving, legal decisions, or alcoholic beverages  Safety precautions  Eat anything you prefer: It is better to start with liquids, then soup then solid foods.  _____ Eye patch should be worn until postoperative exam tomorrow.  ____ Solar shield eyeglasses should be worn for comfort in the sunlight/patch while sleeping  Resume all regular medications including aspirin or Coumadin if these were discontinued prior to surgery. You may shower, bathe, shave, or wash your hair. Tylenol may be taken for mild discomfort.  Call your doctor if you experience significant pain, nausea, or vomiting, fever > 101 or other signs of infection. [phone] or [phone] Specific instructions:  Follow-up Information    Leandrew Koyanagi, MD Follow up on 06/20/2018.   Specialty:  Ophthalmology Why:  at 2:55pm Contact information: [address]   [place] [address] [phone]

## 2018-06-20 NOTE — H&P (Signed)

## 2018-06-20 NOTE — Anesthesia Preprocedure Evaluation (Signed)
Anesthesia Evaluation  Patient identified by MRN, date of birth, ID band Patient awake    Reviewed: Allergy & Precautions, NPO status , Patient's Chart, lab work & pertinent test results  History of Anesthesia Complications Negative for: history of anesthetic complications  Airway Mallampati: II  TM Distance: <3 FB Neck ROM: Full    Dental  (+) Missing   Pulmonary sleep apnea and Continuous Positive Airway Pressure Ventilation , neg COPD,    breath sounds clear to auscultation- rhonchi (-) wheezing      Cardiovascular Exercise Tolerance: Good hypertension, Pt. on medications (-) CAD, (-) Past MI, (-) Cardiac Stents and (-) CABG  Rhythm:Regular Rate:Normal - Systolic murmurs and - Diastolic murmurs    Neuro/Psych neg Seizures negative neurological ROS  negative psych ROS   GI/Hepatic Neg liver ROS, hiatal hernia, GERD  ,  Endo/Other  negative endocrine ROSneg diabetes  Renal/GU negative Renal ROS     Musculoskeletal  (+) Arthritis ,   Abdominal (+) - obese,   Peds  Hematology negative hematology ROS (+)   Anesthesia Other Findings Past Medical History: No date: Arthritis No date: Carotid artery stenosis No date: Environmental and seasonal allergies No date: GERD (gastroesophageal reflux disease) No date: HH (hiatus hernia) No date: HOH (hard of hearing) No date: Hypertension No date: Lower extremity edema No date: Neuropathy No date: Osteoporosis No date: Sleep apnea   Reproductive/Obstetrics                             Anesthesia Physical Anesthesia Plan  ASA: II  Anesthesia Plan: MAC   Post-op Pain Management:    Induction: Intravenous  PONV Risk Score and Plan: 2 and Midazolam  Airway Management Planned: Natural Airway  Additional Equipment:   Intra-op Plan:   Post-operative Plan:   Informed Consent: I have reviewed the patients History and Physical, chart, labs  and discussed the procedure including the risks, benefits and alternatives for the proposed anesthesia with the patient or authorized representative who has indicated his/her understanding and acceptance.       Plan Discussed with: CRNA and Anesthesiologist  Anesthesia Plan Comments:         Anesthesia Quick Evaluation

## 2018-06-20 NOTE — Op Note (Signed)
OPERATIVE NOTE  Kaitlyn Nelson [id number] 06/20/2018   PREOPERATIVE DIAGNOSIS:  Nuclear sclerotic cataract left eye. H25.12   POSTOPERATIVE DIAGNOSIS:    Nuclear sclerotic cataract left eye.     PROCEDURE:  Phacoemusification with posterior chamber intraocular lens placement of the left eye   LENS:   Implant Name Type Inv. Item Serial No. Manufacturer Lot No. LRB No. Used  LENS IOL DIOP 16.0 - [id number] Intraocular Lens LENS IOL DIOP 16.0 [id number] AMO  Left 1        ULTRASOUND TIME: 12 % of 0 minutes, 48 seconds.  CDE 3.2   SURGEON:  Wyonia Hough, MD   ANESTHESIA:  Topical with tetracaine drops and 2% Xylocaine jelly, augmented with 1% preservative-free intracameral lidocaine.    COMPLICATIONS:  None.   DESCRIPTION OF PROCEDURE:  The patient was identified in the holding room and transported to the operating room and placed in the supine position under the operating microscope.  The left eye was identified as the operative eye and it was prepped and draped in the usual sterile ophthalmic fashion.   A 1 millimeter clear-corneal paracentesis was made at the 1:30 position. 0.5 ml of preservative-free 1% lidocaine was injected into the anterior chamber.  The anterior chamber was filled with Viscoat viscoelastic.  A 2.4 millimeter keratome was used to make a near-clear corneal incision at the 10:30 position.  .  A curvilinear capsulorrhexis was made with a cystotome and capsulorrhexis forceps.  Balanced salt solution was used to hydrodissect and hydrodelineate the nucleus.   Phacoemulsification was then used in stop and chop fashion to remove the lens nucleus and epinucleus.  The remaining cortex was then removed using the irrigation and aspiration handpiece. Provisc was then placed into the capsular bag to distend it for lens placement.  A lens was then injected into the capsular bag.  The remaining viscoelastic was aspirated.   Wounds were hydrated with balanced  salt solution.  The anterior chamber was inflated to a physiologic pressure with balanced salt solution. Vigamox 0.2 ml of a 1mg  per ml solution was injected into the anterior chamber for a dose of 0.2 mg of intracameral antibiotic at the completion of the case.  Miostat was placed into the anterior chamber to constrict the pupil.  No wound leaks were noted.  Topical Vigamox drops and Maxitrol ointment were applied to the eye.  The patient was taken to the recovery room in stable condition without complications of anesthesia or surgery  Jaeline Whobrey 06/20/2018, 12:07 PM

## 2018-06-21 ENCOUNTER — Ambulatory Visit: Admit: 2018-06-21 | Payer: Medicare Other | Admitting: Ophthalmology

## 2018-06-21 SURGERY — PHACOEMULSIFICATION, CATARACT, WITH IOL INSERTION
Anesthesia: Topical | Laterality: Left

## 2018-07-02 ENCOUNTER — Emergency Department: Payer: 59

## 2018-07-02 ENCOUNTER — Emergency Department
Admission: EM | Admit: 2018-07-02 | Discharge: 2018-07-02 | Payer: 59 | Attending: Emergency Medicine | Admitting: Emergency Medicine

## 2018-07-02 ENCOUNTER — Other Ambulatory Visit: Payer: Self-pay

## 2018-07-02 DIAGNOSIS — R109 Unspecified abdominal pain: Secondary | ICD-10-CM | POA: Insufficient documentation

## 2018-07-02 DIAGNOSIS — Y92410 Unspecified street and highway as the place of occurrence of the external cause: Secondary | ICD-10-CM | POA: Diagnosis not present

## 2018-07-02 DIAGNOSIS — R51 Headache: Secondary | ICD-10-CM | POA: Diagnosis not present

## 2018-07-02 DIAGNOSIS — T1490XA Injury, unspecified, initial encounter: Secondary | ICD-10-CM

## 2018-07-02 DIAGNOSIS — S299XXA Unspecified injury of thorax, initial encounter: Secondary | ICD-10-CM | POA: Diagnosis present

## 2018-07-02 DIAGNOSIS — M25531 Pain in right wrist: Secondary | ICD-10-CM | POA: Diagnosis not present

## 2018-07-02 DIAGNOSIS — S2243XA Multiple fractures of ribs, bilateral, initial encounter for closed fracture: Secondary | ICD-10-CM | POA: Insufficient documentation

## 2018-07-02 DIAGNOSIS — Z79899 Other long term (current) drug therapy: Secondary | ICD-10-CM | POA: Insufficient documentation

## 2018-07-02 DIAGNOSIS — Y9389 Activity, other specified: Secondary | ICD-10-CM | POA: Diagnosis not present

## 2018-07-02 DIAGNOSIS — M79606 Pain in leg, unspecified: Secondary | ICD-10-CM | POA: Diagnosis not present

## 2018-07-02 DIAGNOSIS — I10 Essential (primary) hypertension: Secondary | ICD-10-CM | POA: Insufficient documentation

## 2018-07-02 DIAGNOSIS — Y999 Unspecified external cause status: Secondary | ICD-10-CM | POA: Diagnosis not present

## 2018-07-02 LAB — CBC WITH DIFFERENTIAL/PLATELET
Abs Immature Granulocytes: 0.07 10*3/uL (ref 0.00–0.07)
Basophils Absolute: 0 10*3/uL (ref 0.0–0.1)
Basophils Relative: 0 %
Eosinophils Absolute: 0.1 10*3/uL (ref 0.0–0.5)
Eosinophils Relative: 0 %
HEMATOCRIT: 42.8 % (ref 36.0–46.0)
Hemoglobin: 13.6 g/dL (ref 12.0–15.0)
Immature Granulocytes: 1 %
LYMPHS ABS: 1.2 10*3/uL (ref 0.7–4.0)
Lymphocytes Relative: 8 %
MCH: 29.4 pg (ref 26.0–34.0)
MCHC: 31.8 g/dL (ref 30.0–36.0)
MCV: 92.6 fL (ref 80.0–100.0)
Monocytes Absolute: 0.9 10*3/uL (ref 0.1–1.0)
Monocytes Relative: 6 %
Neutro Abs: 12.7 10*3/uL — ABNORMAL HIGH (ref 1.7–7.7)
Neutrophils Relative %: 85 %
Platelets: 226 10*3/uL (ref 150–400)
RBC: 4.62 MIL/uL (ref 3.87–5.11)
RDW: 13.7 % (ref 11.5–15.5)
WBC: 15 10*3/uL — ABNORMAL HIGH (ref 4.0–10.5)
nRBC: 0 % (ref 0.0–0.2)

## 2018-07-02 LAB — BASIC METABOLIC PANEL
Anion gap: 10 (ref 5–15)
Anion gap: 9 (ref 5–15)
BUN: 23 mg/dL (ref 8–23)
BUN: 23 mg/dL (ref 8–23)
CALCIUM: 9 mg/dL (ref 8.9–10.3)
CO2: 24 mmol/L (ref 22–32)
CO2: 26 mmol/L (ref 22–32)
Calcium: 8.9 mg/dL (ref 8.9–10.3)
Chloride: 103 mmol/L (ref 98–111)
Chloride: 100 mmol/L (ref 98–111)
Creatinine, Ser: 0.66 mg/dL (ref 0.44–1.00)
Creatinine, Ser: 0.61 mg/dL (ref 0.44–1.00)
GFR calc Af Amer: >60 mL/min (ref >60)
GFR calc Af Amer: >60 mL/min (ref >60)
GFR calc non Af Amer: >60 mL/min (ref >60)
GFR calc non Af Amer: >60 mL/min (ref >60)
GLUCOSE: 139 mg/dL — AB (ref 70–99)
Glucose, Bld: 119 mg/dL — ABNORMAL HIGH (ref 70–99)
Potassium: 6.2 mmol/L — ABNORMAL HIGH (ref 3.5–5.1)
Potassium: 4.1 mmol/L (ref 3.5–5.1)
Sodium: 137 mmol/L (ref 135–145)
Sodium: 135 mmol/L (ref 135–145)

## 2018-07-02 MED ORDER — FENTANYL CITRATE (PF) 100 MCG/2ML IJ SOLN
50.0000 ug | Freq: Once | INTRAMUSCULAR | Status: AC
  Administered 2018-07-02: 50 ug via INTRAVENOUS
  Filled 2018-07-02: qty 2

## 2018-07-02 MED ORDER — ACETAMINOPHEN 325 MG PO TABS
650.0000 mg | ORAL_TABLET | Freq: Once | ORAL | Status: AC
  Administered 2018-07-02: 650 mg via ORAL
  Filled 2018-07-02: qty 2

## 2018-07-02 MED ORDER — IOPAMIDOL (ISOVUE-300) INJECTION 61%
100.0000 mL | Freq: Once | INTRAVENOUS | Status: AC | PRN
  Administered 2018-07-02: 100 mL via INTRAVENOUS
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
## 2018-07-02 NOTE — ED Notes (Signed)
Pt has c-collar present

## 2018-07-03 MED ORDER — GENERIC EXTERNAL MEDICATION
2.00 | Status: DC
Start: ? — End: 2018-07-03

## 2018-07-03 MED ORDER — LIDOCAINE 5 % EX PTCH
2.00 | MEDICATED_PATCH | CUTANEOUS | Status: DC
Start: 2018-07-04 — End: 2018-07-03

## 2018-07-03 MED ORDER — GENERIC EXTERNAL MEDICATION
15.00 | Status: DC
Start: ? — End: 2018-07-03

## 2018-07-03 MED ORDER — DOCUSATE SODIUM 100 MG PO CAPS
100.00 | ORAL_CAPSULE | ORAL | Status: DC
Start: 2018-07-03 — End: 2018-07-03

## 2018-07-03 MED ORDER — GENERIC EXTERNAL MEDICATION
30.00 | Status: DC
Start: ? — End: 2018-07-03

## 2018-07-03 MED ORDER — POLYETHYLENE GLYCOL 3350 17 G PO PACK
17.00 | PACK | ORAL | Status: DC
Start: 2018-07-04 — End: 2018-07-03

## 2018-07-03 MED ORDER — PREDNISOLONE ACETATE 1 % OP SUSP
1.00 | OPHTHALMIC | Status: DC
Start: 2018-07-03 — End: 2018-07-03

## 2018-07-03 MED ORDER — GENERIC EXTERNAL MEDICATION
4.00 | Status: DC
Start: ? — End: 2018-07-03

## 2018-07-03 MED ORDER — POTASSIUM CHLORIDE 10 MEQ/100ML IV SOLN
10.00 | INTRAVENOUS | Status: DC
Start: ? — End: 2018-07-03

## 2018-07-03 MED ORDER — GENERIC EXTERNAL MEDICATION
5.00 | Status: DC
Start: ? — End: 2018-07-03

## 2018-07-03 MED ORDER — HYDROMORPHONE HCL 1 MG/ML IJ SOLN
0.20 | INTRAMUSCULAR | Status: DC
Start: ? — End: 2018-07-03

## 2018-07-03 MED ORDER — GENERIC EXTERNAL MEDICATION
1.00 | Status: DC
Start: ? — End: 2018-07-03

## 2018-07-03 MED ORDER — GENERIC EXTERNAL MEDICATION
40.00 | Status: DC
Start: 2018-07-04 — End: 2018-07-03

## 2018-07-03 MED ORDER — MAGNESIUM SULFATE 2 GM/50ML IV SOLN
2.00 | INTRAVENOUS | Status: DC
Start: ? — End: 2018-07-03

## 2018-07-03 MED ORDER — ACETAMINOPHEN 325 MG PO TABS
650.00 | ORAL_TABLET | ORAL | Status: DC
Start: 2018-07-03 — End: 2018-07-03

## 2018-07-03 MED ORDER — IBUPROFEN 400 MG PO TABS
400.00 | ORAL_TABLET | ORAL | Status: DC
Start: 2018-07-03 — End: 2018-07-03

## 2018-07-03 MED ORDER — METOPROLOL TARTRATE 25 MG PO TABS
75.00 | ORAL_TABLET | ORAL | Status: DC
Start: 2018-07-03 — End: 2018-07-03

## 2018-07-03 MED ORDER — GABAPENTIN 100 MG PO CAPS
100.00 | ORAL_CAPSULE | ORAL | Status: DC
Start: 2018-07-03 — End: 2018-07-03

## 2018-07-10 DIAGNOSIS — E669 Obesity, unspecified: Secondary | ICD-10-CM | POA: Insufficient documentation

## 2018-07-10 DIAGNOSIS — S2249XA Multiple fractures of ribs, unspecified side, initial encounter for closed fracture: Secondary | ICD-10-CM | POA: Insufficient documentation

## 2018-07-10 DIAGNOSIS — S2220XA Unspecified fracture of sternum, initial encounter for closed fracture: Secondary | ICD-10-CM | POA: Insufficient documentation

## 2018-07-25 ENCOUNTER — Other Ambulatory Visit: Payer: Self-pay | Admitting: Internal Medicine

## 2018-07-25 DIAGNOSIS — R600 Localized edema: Secondary | ICD-10-CM

## 2018-07-26 ENCOUNTER — Ambulatory Visit
Admission: RE | Admit: 2018-07-26 | Discharge: 2018-07-26 | Disposition: A | Discharge status: Home / Self Care | Payer: 59 | Source: Ambulatory Visit | Attending: Internal Medicine | Admitting: Internal Medicine

## 2018-07-26 DIAGNOSIS — R600 Localized edema: Secondary | ICD-10-CM | POA: Insufficient documentation

## 2018-09-06 ENCOUNTER — Ambulatory Visit: Admission: RE | Admit: 2018-09-06 | Payer: 59 | Source: Home / Self Care | Admitting: Ophthalmology

## 2018-09-06 ENCOUNTER — Encounter: Admission: RE | Payer: Self-pay | Source: Home / Self Care

## 2018-09-06 SURGERY — PHACOEMULSIFICATION, CATARACT, WITH IOL INSERTION
Anesthesia: Choice | Laterality: Right

## 2018-09-13 ENCOUNTER — Other Ambulatory Visit: Payer: Self-pay | Admitting: Internal Medicine

## 2018-09-13 ENCOUNTER — Other Ambulatory Visit (HOSPITAL_COMMUNITY): Payer: Self-pay | Admitting: Internal Medicine

## 2018-09-13 ENCOUNTER — Ambulatory Visit: Payer: Medicare Other

## 2018-09-13 DIAGNOSIS — Z1239 Encounter for other screening for malignant neoplasm of breast: Secondary | ICD-10-CM

## 2018-09-13 DIAGNOSIS — Z1231 Encounter for screening mammogram for malignant neoplasm of breast: Secondary | ICD-10-CM

## 2018-09-13 DIAGNOSIS — M7989 Other specified soft tissue disorders: Secondary | ICD-10-CM

## 2018-09-14 ENCOUNTER — Ambulatory Visit: Payer: 59

## 2018-09-15 ENCOUNTER — Ambulatory Visit
Admission: RE | Admit: 2018-09-15 | Discharge: 2018-09-15 | Disposition: A | Discharge status: Home / Self Care | Payer: 59 | Source: Ambulatory Visit | Attending: Internal Medicine | Admitting: Internal Medicine

## 2018-09-15 ENCOUNTER — Other Ambulatory Visit: Payer: Self-pay

## 2018-09-15 DIAGNOSIS — M7989 Other specified soft tissue disorders: Secondary | ICD-10-CM | POA: Insufficient documentation

## 2018-10-30 ENCOUNTER — Other Ambulatory Visit: Payer: Self-pay

## 2018-10-30 ENCOUNTER — Ambulatory Visit
Admission: RE | Admit: 2018-10-30 | Discharge: 2018-10-30 | Disposition: A | Discharge status: Home / Self Care | Payer: 59 | Source: Ambulatory Visit | Attending: Internal Medicine | Admitting: Internal Medicine

## 2018-10-30 DIAGNOSIS — Z1239 Encounter for other screening for malignant neoplasm of breast: Secondary | ICD-10-CM

## 2018-10-30 DIAGNOSIS — Z1231 Encounter for screening mammogram for malignant neoplasm of breast: Secondary | ICD-10-CM | POA: Insufficient documentation

## 2018-10-31 ENCOUNTER — Other Ambulatory Visit: Payer: Self-pay | Admitting: Internal Medicine

## 2018-10-31 DIAGNOSIS — N6310 Unspecified lump in the right breast, unspecified quadrant: Secondary | ICD-10-CM

## 2018-11-03 ENCOUNTER — Other Ambulatory Visit: Payer: Self-pay

## 2018-11-03 ENCOUNTER — Ambulatory Visit
Admission: RE | Admit: 2018-11-03 | Discharge: 2018-11-03 | Disposition: A | Discharge status: Home / Self Care | Payer: 59 | Source: Ambulatory Visit | Attending: Internal Medicine | Admitting: Internal Medicine

## 2018-11-03 DIAGNOSIS — N641 Fat necrosis of breast: Secondary | ICD-10-CM | POA: Diagnosis not present

## 2018-11-03 DIAGNOSIS — N6310 Unspecified lump in the right breast, unspecified quadrant: Secondary | ICD-10-CM

## 2018-11-07 ENCOUNTER — Other Ambulatory Visit: Payer: Self-pay | Admitting: Internal Medicine

## 2018-11-07 DIAGNOSIS — N63 Unspecified lump in unspecified breast: Secondary | ICD-10-CM

## 2018-11-07 DIAGNOSIS — R928 Other abnormal and inconclusive findings on diagnostic imaging of breast: Secondary | ICD-10-CM

## 2018-12-05 NOTE — Discharge Instructions (Signed)

## 2018-12-06 ENCOUNTER — Other Ambulatory Visit: Payer: Self-pay

## 2018-12-06 ENCOUNTER — Encounter: Payer: Self-pay | Admitting: *Deleted

## 2018-12-08 ENCOUNTER — Other Ambulatory Visit: Payer: Self-pay

## 2018-12-08 ENCOUNTER — Other Ambulatory Visit
Admission: RE | Admit: 2018-12-08 | Discharge: 2018-12-08 | Disposition: A | Discharge status: Home / Self Care | Payer: 59 | Source: Ambulatory Visit | Attending: Ophthalmology | Admitting: Ophthalmology

## 2018-12-08 DIAGNOSIS — Z1159 Encounter for screening for other viral diseases: Secondary | ICD-10-CM | POA: Diagnosis not present

## 2018-12-08 DIAGNOSIS — Z01812 Encounter for preprocedural laboratory examination: Secondary | ICD-10-CM | POA: Diagnosis not present

## 2018-12-08 LAB — SARS CORONAVIRUS 2 (TAT 6-24 HRS): SARS Coronavirus 2: NEGATIVE

## 2018-12-13 ENCOUNTER — Other Ambulatory Visit: Payer: Self-pay

## 2018-12-13 ENCOUNTER — Ambulatory Visit: Payer: 59 | Admitting: Anesthesiology

## 2018-12-13 ENCOUNTER — Encounter
Admission: RE | Disposition: A | Discharge status: Home / Self Care | Payer: Self-pay | Source: Home / Self Care | Attending: Ophthalmology

## 2018-12-13 ENCOUNTER — Ambulatory Visit
Admission: RE | Admit: 2018-12-13 | Discharge: 2018-12-13 | Disposition: A | Discharge status: Home / Self Care | Payer: 59 | Attending: Ophthalmology | Admitting: Ophthalmology

## 2018-12-13 DIAGNOSIS — G629 Polyneuropathy, unspecified: Secondary | ICD-10-CM | POA: Insufficient documentation

## 2018-12-13 DIAGNOSIS — H2511 Age-related nuclear cataract, right eye: Secondary | ICD-10-CM | POA: Insufficient documentation

## 2018-12-13 DIAGNOSIS — H9190 Unspecified hearing loss, unspecified ear: Secondary | ICD-10-CM | POA: Insufficient documentation

## 2018-12-13 DIAGNOSIS — K219 Gastro-esophageal reflux disease without esophagitis: Secondary | ICD-10-CM | POA: Insufficient documentation

## 2018-12-13 DIAGNOSIS — Z79899 Other long term (current) drug therapy: Secondary | ICD-10-CM | POA: Insufficient documentation

## 2018-12-13 DIAGNOSIS — M810 Age-related osteoporosis without current pathological fracture: Secondary | ICD-10-CM | POA: Insufficient documentation

## 2018-12-13 DIAGNOSIS — M1990 Unspecified osteoarthritis, unspecified site: Secondary | ICD-10-CM | POA: Insufficient documentation

## 2018-12-13 DIAGNOSIS — G4730 Sleep apnea, unspecified: Secondary | ICD-10-CM | POA: Insufficient documentation

## 2018-12-13 HISTORY — DX: Benign paroxysmal vertigo, unspecified ear: H81.10

## 2018-12-13 HISTORY — PX: CATARACT EXTRACTION W/PHACO: SHX586

## 2018-12-13 SURGERY — PHACOEMULSIFICATION, CATARACT, WITH IOL INSERTION
Anesthesia: Topical | Site: Eye | Laterality: Right

## 2018-12-13 MED ORDER — CEFUROXIME OPHTHALMIC INJECTION 1 MG/0.1 ML
INJECTION | OPHTHALMIC | Status: DC | PRN
  Administered 2018-12-13: 0.1 mL via INTRACAMERAL

## 2018-12-13 MED ORDER — NA HYALUR & NA CHOND-NA HYALUR 0.4-0.35 ML IO KIT
PACK | INTRAOCULAR | Status: DC | PRN
  Administered 2018-12-13: 1 mL via INTRAOCULAR

## 2018-12-13 MED ORDER — FENTANYL CITRATE (PF) 100 MCG/2ML IJ SOLN
INTRAMUSCULAR | Status: DC | PRN
  Administered 2018-12-13: 50 ug via INTRAVENOUS

## 2018-12-13 MED ORDER — MIDAZOLAM HCL 2 MG/2ML IJ SOLN
INTRAMUSCULAR | Status: DC | PRN
  Administered 2018-12-13: 2 mg via INTRAVENOUS

## 2018-12-13 MED ORDER — TETRACAINE HCL 0.5 % OP SOLN
1.0000 [drp] | OPHTHALMIC | Status: DC | PRN
  Administered 2018-12-13 (×3): 1 [drp] via OPHTHALMIC

## 2018-12-13 MED ORDER — LIDOCAINE HCL (PF) 2 % IJ SOLN
INTRAOCULAR | Status: DC | PRN
  Administered 2018-12-13: 2 mL

## 2018-12-13 MED ORDER — ARMC OPHTHALMIC DILATING DROPS
1.0000 "application " | OPHTHALMIC | Status: DC | PRN
  Administered 2018-12-13 (×3): 1 via OPHTHALMIC

## 2018-12-13 MED ORDER — MOXIFLOXACIN HCL 0.5 % OP SOLN
1.0000 [drp] | OPHTHALMIC | Status: DC | PRN
  Administered 2018-12-13 (×3): 1 [drp] via OPHTHALMIC

## 2018-12-13 MED ORDER — EPINEPHRINE PF 1 MG/ML IJ SOLN
INTRAOCULAR | Status: DC | PRN
  Administered 2018-12-13: 76 mL via OPHTHALMIC

## 2018-12-13 MED ORDER — BRIMONIDINE TARTRATE-TIMOLOL 0.2-0.5 % OP SOLN
OPHTHALMIC | Status: DC | PRN
  Administered 2018-12-13: 1 [drp] via OPHTHALMIC

## 2018-12-13 SURGICAL SUPPLY — 20 items
CANNULA ANT/CHMB 27G (MISCELLANEOUS) ×1 IMPLANT
CANNULA ANT/CHMB 27GA (MISCELLANEOUS) ×2 IMPLANT
GLOVE SURG LX 7.5 STRW (GLOVE) ×1
GLOVE SURG LX STRL 7.5 STRW (GLOVE) ×1 IMPLANT
GLOVE SURG TRIUMPH 8.0 PF LTX (GLOVE) ×2 IMPLANT
GOWN STRL REUS W/ TWL LRG LVL3 (GOWN DISPOSABLE) ×2 IMPLANT
GOWN STRL REUS W/TWL LRG LVL3 (GOWN DISPOSABLE) ×2
LENS IOL TECNIS ITEC 17.0 (Intraocular Lens) ×1 IMPLANT
MARKER SKIN DUAL TIP RULER LAB (MISCELLANEOUS) ×2 IMPLANT
NDL FILTER BLUNT 18X1 1/2 (NEEDLE) ×1 IMPLANT
NEEDLE FILTER BLUNT 18X 1/2SAF (NEEDLE) ×1
NEEDLE FILTER BLUNT 18X1 1/2 (NEEDLE) ×1 IMPLANT
PACK CATARACT BRASINGTON (MISCELLANEOUS) ×2 IMPLANT
PACK EYE AFTER SURG (MISCELLANEOUS) ×2 IMPLANT
PACK OPTHALMIC (MISCELLANEOUS) ×2 IMPLANT
SYR 3ML LL SCALE MARK (SYRINGE) ×2 IMPLANT
SYR 5ML LL (SYRINGE) ×2 IMPLANT
SYR TB 1ML LUER SLIP (SYRINGE) ×2 IMPLANT
WATER STERILE IRR 500ML POUR (IV SOLUTION) ×2 IMPLANT
WIPE NON LINTING 3.25X3.25 (MISCELLANEOUS) ×2 IMPLANT

## 2018-12-13 NOTE — Anesthesia Postprocedure Evaluation (Signed)
Anesthesia Post Note  Patient: Kaitlyn Nelson  Procedure(s) Performed: CATARACT EXTRACTION PHACO AND INTRAOCULAR LENS PLACEMENT (IOC)RIGHT (Right Eye)  Patient location during evaluation: PACU Anesthesia Type: MAC Level of consciousness: awake Pain management: pain level controlled Vital Signs Assessment: post-procedure vital signs reviewed and stable Respiratory status: spontaneous breathing Cardiovascular status: stable Anesthetic complications: no    Venesa Semidey, III,  Paw Karstens D

## 2018-12-13 NOTE — H&P (Signed)
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
## 2018-12-13 NOTE — Op Note (Signed)
LOCATION:  [place]   PREOPERATIVE DIAGNOSIS:    Nuclear sclerotic cataract right eye. H25.11   POSTOPERATIVE DIAGNOSIS:  Nuclear sclerotic cataract right eye.     PROCEDURE:  Phacoemusification with posterior chamber intraocular lens placement of the right eye   LENS:   Implant Name Type Inv. Item Serial No. Manufacturer Lot No. LRB No. Used Action  LENS IOL DIOP 17.0 - [id number] Intraocular Lens LENS IOL DIOP 17.0 [id number]  Right 1 Implanted        ULTRASOUND TIME: 15 % of 0 minutes, 50 seconds.  CDE 7.4   SURGEON:  Wyonia Hough, MD   ANESTHESIA:  Topical with tetracaine drops and 2% Xylocaine jelly, augmented with 1% preservative-free intracameral lidocaine.    COMPLICATIONS:  None.   DESCRIPTION OF PROCEDURE:  The patient was identified in the holding room and transported to the operating room and placed in the supine position under the operating microscope.  The right eye was identified as the operative eye and it was prepped and draped in the usual sterile ophthalmic fashion.   A 1 millimeter clear-corneal paracentesis was made at the 12:00 position.  0.5 ml of preservative-free 1% lidocaine was injected into the anterior chamber. The anterior chamber was filled with Viscoat viscoelastic.  A 2.4 millimeter keratome was used to make a near-clear corneal incision at the 9:00 position.  A curvilinear capsulorrhexis was made with a cystotome and capsulorrhexis forceps.  Balanced salt solution was used to hydrodissect and hydrodelineate the nucleus.   Phacoemulsification was then used in stop and chop fashion to remove the lens nucleus and epinucleus.  The remaining cortex was then removed using the irrigation and aspiration handpiece. Provisc was then placed into the capsular bag to distend it for lens placement.  A lens was then injected into the capsular bag.  The remaining viscoelastic was aspirated.   Wounds were hydrated with balanced salt solution.   The anterior chamber was inflated to a physiologic pressure with balanced salt solution.  No wound leaks were noted. Cefuroxime 0.1 ml of a 10mg /ml solution was injected into the anterior chamber for a dose of 1 mg of intracameral antibiotic at the completion of the case.   Timolol and Brimonidine drops were applied to the eye.  The patient was taken to the recovery room in stable condition without complications of anesthesia or surgery.   Kaitlyn Nelson 12/13/2018, 10:37 AM

## 2018-12-13 NOTE — Transfer of Care (Signed)
Immediate Anesthesia Transfer of Care Note  Patient: Kaitlyn Nelson  Procedure(s) Performed: CATARACT EXTRACTION PHACO AND INTRAOCULAR LENS PLACEMENT (IOC)RIGHT (Right Eye)  Patient Location: PACU  Anesthesia Type: No value filed.  Level of Consciousness: awake, alert  and patient cooperative  Airway and Oxygen Therapy: Patient Spontanous Breathing and Patient connected to supplemental oxygen  Post-op Assessment: Post-op Vital signs reviewed, Patient's Cardiovascular Status Stable, Respiratory Function Stable, Patent Airway and No signs of Nausea or vomiting  Post-op Vital Signs: Reviewed and stable  Complications: No apparent anesthesia complications

## 2018-12-14 ENCOUNTER — Encounter: Payer: Self-pay | Admitting: Ophthalmology

## 2019-02-13 ENCOUNTER — Ambulatory Visit
Admission: RE | Admit: 2019-02-13 | Discharge: 2019-02-13 | Disposition: A | Discharge status: Home / Self Care | Payer: 59 | Source: Ambulatory Visit | Attending: Internal Medicine | Admitting: Internal Medicine

## 2019-02-13 DIAGNOSIS — R928 Other abnormal and inconclusive findings on diagnostic imaging of breast: Secondary | ICD-10-CM | POA: Diagnosis not present

## 2019-02-13 DIAGNOSIS — N63 Unspecified lump in unspecified breast: Secondary | ICD-10-CM | POA: Insufficient documentation

## 2019-02-20 ENCOUNTER — Other Ambulatory Visit: Payer: Self-pay | Admitting: Internal Medicine

## 2019-02-20 DIAGNOSIS — Z1231 Encounter for screening mammogram for malignant neoplasm of breast: Secondary | ICD-10-CM

## 2019-03-12 DIAGNOSIS — I6523 Occlusion and stenosis of bilateral carotid arteries: Secondary | ICD-10-CM | POA: Insufficient documentation

## 2019-09-18 ENCOUNTER — Other Ambulatory Visit: Payer: Self-pay | Admitting: Internal Medicine

## 2019-09-18 DIAGNOSIS — R109 Unspecified abdominal pain: Secondary | ICD-10-CM

## 2019-09-19 ENCOUNTER — Other Ambulatory Visit: Payer: Self-pay | Admitting: Internal Medicine

## 2019-09-19 DIAGNOSIS — Z1231 Encounter for screening mammogram for malignant neoplasm of breast: Secondary | ICD-10-CM

## 2019-09-21 ENCOUNTER — Other Ambulatory Visit: Payer: Medicare Other

## 2019-09-25 ENCOUNTER — Other Ambulatory Visit: Payer: Self-pay

## 2019-09-25 ENCOUNTER — Ambulatory Visit: Payer: Medicare Other

## 2019-09-25 ENCOUNTER — Ambulatory Visit
Admission: RE | Admit: 2019-09-25 | Discharge: 2019-09-25 | Disposition: A | Discharge status: Home / Self Care | Payer: 59 | Source: Ambulatory Visit | Attending: Internal Medicine | Admitting: Internal Medicine

## 2019-09-25 DIAGNOSIS — R109 Unspecified abdominal pain: Secondary | ICD-10-CM | POA: Diagnosis not present

## 2019-11-08 ENCOUNTER — Ambulatory Visit
Admission: RE | Admit: 2019-11-08 | Discharge: 2019-11-08 | Disposition: A | Discharge status: Home / Self Care | Payer: 59 | Source: Ambulatory Visit | Attending: Internal Medicine | Admitting: Internal Medicine

## 2019-11-08 DIAGNOSIS — Z1231 Encounter for screening mammogram for malignant neoplasm of breast: Secondary | ICD-10-CM | POA: Diagnosis not present

## 2019-11-26 ENCOUNTER — Encounter (INDEPENDENT_AMBULATORY_CARE_PROVIDER_SITE_OTHER): Payer: Self-pay | Admitting: Nurse Practitioner

## 2019-11-26 ENCOUNTER — Ambulatory Visit (INDEPENDENT_AMBULATORY_CARE_PROVIDER_SITE_OTHER): Payer: 59 | Admitting: Nurse Practitioner

## 2019-11-26 ENCOUNTER — Other Ambulatory Visit: Payer: Self-pay

## 2019-11-26 VITALS — BP 181/82 | HR 74 | Resp 16 | Ht 59.0 in | Wt 160.0 lb

## 2019-11-26 DIAGNOSIS — M79661 Pain in right lower leg: Secondary | ICD-10-CM | POA: Diagnosis not present

## 2019-11-26 DIAGNOSIS — I10 Essential (primary) hypertension: Secondary | ICD-10-CM

## 2019-11-26 DIAGNOSIS — E782 Mixed hyperlipidemia: Secondary | ICD-10-CM

## 2019-11-26 DIAGNOSIS — M7989 Other specified soft tissue disorders: Secondary | ICD-10-CM | POA: Diagnosis not present

## 2019-11-26 DIAGNOSIS — M79662 Pain in left lower leg: Secondary | ICD-10-CM

## 2019-11-27 ENCOUNTER — Encounter (INDEPENDENT_AMBULATORY_CARE_PROVIDER_SITE_OTHER): Payer: Self-pay | Admitting: Nurse Practitioner
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
## 2019-12-04 ENCOUNTER — Encounter (INDEPENDENT_AMBULATORY_CARE_PROVIDER_SITE_OTHER): Payer: Self-pay | Admitting: Nurse Practitioner

## 2019-12-21 ENCOUNTER — Ambulatory Visit (INDEPENDENT_AMBULATORY_CARE_PROVIDER_SITE_OTHER): Payer: 59

## 2019-12-21 ENCOUNTER — Other Ambulatory Visit (INDEPENDENT_AMBULATORY_CARE_PROVIDER_SITE_OTHER): Payer: Self-pay | Admitting: Nurse Practitioner

## 2019-12-21 ENCOUNTER — Other Ambulatory Visit: Payer: Self-pay

## 2019-12-21 ENCOUNTER — Ambulatory Visit (INDEPENDENT_AMBULATORY_CARE_PROVIDER_SITE_OTHER): Payer: 59 | Admitting: Nurse Practitioner

## 2019-12-21 ENCOUNTER — Encounter (INDEPENDENT_AMBULATORY_CARE_PROVIDER_SITE_OTHER): Payer: Self-pay | Admitting: Nurse Practitioner

## 2019-12-21 VITALS — BP 175/83 | HR 64 | Resp 16 | Wt 160.0 lb

## 2019-12-21 DIAGNOSIS — M7989 Other specified soft tissue disorders: Secondary | ICD-10-CM

## 2019-12-21 DIAGNOSIS — I890 Lymphedema, not elsewhere classified: Secondary | ICD-10-CM

## 2019-12-21 DIAGNOSIS — I10 Essential (primary) hypertension: Secondary | ICD-10-CM | POA: Diagnosis not present

## 2019-12-21 DIAGNOSIS — M79662 Pain in left lower leg: Secondary | ICD-10-CM | POA: Diagnosis not present

## 2019-12-21 DIAGNOSIS — M79604 Pain in right leg: Secondary | ICD-10-CM

## 2019-12-21 DIAGNOSIS — M79661 Pain in right lower leg: Secondary | ICD-10-CM | POA: Diagnosis not present

## 2019-12-21 DIAGNOSIS — R600 Localized edema: Secondary | ICD-10-CM

## 2019-12-21 DIAGNOSIS — M79605 Pain in left leg: Secondary | ICD-10-CM | POA: Diagnosis not present

## 2019-12-26 ENCOUNTER — Encounter (INDEPENDENT_AMBULATORY_CARE_PROVIDER_SITE_OTHER): Payer: Self-pay | Admitting: Nurse Practitioner
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
## 2020-06-03 ENCOUNTER — Other Ambulatory Visit: Payer: Self-pay | Admitting: Physician Assistant

## 2020-06-03 ENCOUNTER — Ambulatory Visit: Payer: Medicare Other

## 2020-06-03 DIAGNOSIS — L03116 Cellulitis of left lower limb: Secondary | ICD-10-CM

## 2020-06-05 ENCOUNTER — Other Ambulatory Visit: Payer: Self-pay

## 2020-06-05 ENCOUNTER — Ambulatory Visit
Admission: RE | Admit: 2020-06-05 | Discharge: 2020-06-05 | Disposition: A | Discharge status: Home / Self Care | Payer: 59 | Source: Ambulatory Visit | Attending: Physician Assistant | Admitting: Physician Assistant

## 2020-06-05 DIAGNOSIS — L03116 Cellulitis of left lower limb: Secondary | ICD-10-CM | POA: Diagnosis not present

## 2020-06-23 ENCOUNTER — Ambulatory Visit (INDEPENDENT_AMBULATORY_CARE_PROVIDER_SITE_OTHER): Payer: Medicare Other | Admitting: Vascular Surgery

## 2020-06-30 ENCOUNTER — Ambulatory Visit (INDEPENDENT_AMBULATORY_CARE_PROVIDER_SITE_OTHER): Payer: 59 | Admitting: Nurse Practitioner

## 2020-06-30 ENCOUNTER — Other Ambulatory Visit: Payer: Self-pay

## 2020-06-30 ENCOUNTER — Encounter (INDEPENDENT_AMBULATORY_CARE_PROVIDER_SITE_OTHER): Payer: Self-pay | Admitting: Nurse Practitioner

## 2020-06-30 VITALS — BP 173/82 | HR 71 | Resp 16 | Wt 162.0 lb

## 2020-06-30 DIAGNOSIS — I890 Lymphedema, not elsewhere classified: Secondary | ICD-10-CM | POA: Diagnosis not present

## 2020-06-30 DIAGNOSIS — E782 Mixed hyperlipidemia: Secondary | ICD-10-CM

## 2020-06-30 DIAGNOSIS — I10 Essential (primary) hypertension: Secondary | ICD-10-CM | POA: Diagnosis not present

## 2020-07-01 ENCOUNTER — Encounter (INDEPENDENT_AMBULATORY_CARE_PROVIDER_SITE_OTHER): Payer: Self-pay | Admitting: Nurse Practitioner

## 2020-07-01 NOTE — Progress Notes (Signed)
Subjective:    Patient ID: Kaitlyn Nelson, female    DOB: 1941-05-28, 80 y.o.   MRN: [id number] Chief Complaint  Patient presents with  . Follow-up    Est pt  vv w/edema    Patient presents today for evaluation of lower extremity edema.  The patient has recently noted that her lower extremity edema has been worsening.  It is worse more so on the left lower extremity than the right.  The patient previously had a accident and had a large hematoma in the left lower extremity as well.  The patient subsequently developed cellulitis which was treated by her primary care physician.  The patient currently has a lymphedema pump and she does endorse using it at least once a day, sometimes twice a day.  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  Currently the cellulitis has resolved but the swelling remains noticed the pain.  The patient endorses that she has not been wearing compression stockings regularly.   Review of Systems  Cardiovascular: Positive for leg swelling.  Hematological: Bruises/bleeds easily.  All other systems reviewed and are negative.      Objective:   Physical Exam Vitals reviewed.  HENT:     Head: Normocephalic.  Cardiovascular:     Rate and Rhythm: Normal rate.     Pulses: Normal pulses.  Pulmonary:     Effort: Pulmonary effort is normal.  Musculoskeletal:     Right lower leg: Edema present.     Left lower leg: Edema present.  Neurological:     Mental Status: She is alert and oriented to person, place, and time.  Psychiatric:        Mood and Affect: Mood normal.        Behavior: Behavior normal.        Thought Content: Thought content normal.        Judgment: Judgment normal.     BP (!) 173/82 (BP Location: Right Arm)   Pulse 71   Resp 16   Wt 162 lb (73.5 kg)   BMI 32.72 kg/m    Past Medical History:  Diagnosis Date  . Arthritis   . BPPV (benign paroxysmal positional vertigo)   . Carotid artery stenosis   . Environmental and seasonal allergies   . GERD (gastroesophageal reflux disease)   . HH (hiatus hernia)   . HOH (hard of hearing)   . Hypertension   . Lower extremity edema   . Neuropathy   . Osteoporosis   . Sleep apnea    CPAP    Social History   Socioeconomic History  . Marital status: Single    Spouse name: Not on file  . Number of children: Not on file  . Years of education: Not on file  . Highest education level: Not on file  Occupational History  . Not on file  Tobacco Use  . Smoking status: Never Smoker  . Smokeless tobacco: Never Used  Vaping Use  . Vaping Use: Never used  Substance and Sexual Activity  . Alcohol use: Never  . Drug use: Never  . Sexual activity: Not on file  Other Topics Concern  . Not on file  Social History Narrative  . Not on file   Social Determinants of Health   Financial Resource Strain: Not on  file  Food Insecurity: Not on file  Transportation Needs: Not on file  Physical Activity: Not on file  Stress: Not on file  Social Connections: Not on file  Intimate Partner Violence: Not on file    Past Surgical History:  Procedure Laterality Date  . ABDOMINAL HYSTERECTOMY    . BREAST CYST ASPIRATION Right    neg  . CATARACT EXTRACTION W/PHACO Left 06/20/2018   Procedure: CATARACT EXTRACTION PHACO AND INTRAOCULAR LENS PLACEMENT ([place]) LEFT;  Surgeon: Leandrew Koyanagi, MD;  Location: [facility] ORS;  Service: Ophthalmology;  Laterality: Left;  [place] 00:47.6 AP% 12.3 CDE 3.19 Fluid Pack Lot # [id number]  . CATARACT EXTRACTION W/PHACO Right 12/13/2018   Procedure: CATARACT EXTRACTION PHACO AND INTRAOCULAR LENS PLACEMENT ([facility])RIGHT;  Surgeon: Leandrew Koyanagi, MD;  Location: [place];  Service: Ophthalmology;  Laterality: Right;  sleep apnea  . COLONOSCOPY WITH PROPOFOL N/A 06/18/2016   Procedure:  COLONOSCOPY WITH PROPOFOL;  Surgeon: Lollie Sails, MD;  Location: [facility] ENDOSCOPY;  Service: Endoscopy;  Laterality: N/A;  . EAR EXAMINATION UNDER ANESTHESIA    . ESOPHAGOGASTRODUODENOSCOPY (EGD) WITH PROPOFOL N/A 06/18/2016   Procedure: ESOPHAGOGASTRODUODENOSCOPY (EGD) WITH PROPOFOL;  Surgeon: Lollie Sails, MD;  Location: [facility] ENDOSCOPY;  Service: Endoscopy;  Laterality: N/A;    Family History  Problem Relation Age of Onset  . Breast cancer Maternal Aunt     Allergies  Allergen Reactions  . Aspirin     Stomach pain  . Augmentin [Amoxicillin-Pot Clavulanate] Diarrhea  . Compazine [Prochlorperazine Edisylate]     Pt states she does not remember   . Ibuprofen     Hurts pt's stomach   . Triamterene   . Sulfa Antibiotics Rash    CBC Latest Ref Rng & Units 07/02/2018 03/02/2013 12/19/2011  WBC 4.0 - 10.5 K/uL 15.0(H) 8.4 6.2  Hemoglobin 12.0 - 15.0 g/dL 13.6 14.4 12.2  Hematocrit 36.0 - 46.0 % 42.8 43.9 36.2  Platelets 150 - 400 K/uL 226 211 260      CMP     Component Value Date/Time   NA 135 07/02/2018 2146   NA 130 (L) 03/02/2013 1441   K 4.1 07/02/2018 2146   K 3.8 03/02/2013 1441   CL 100 07/02/2018 2146   CL 98 03/02/2013 1441   CO2 26 07/02/2018 2146   CO2 26 03/02/2013 1441   GLUCOSE 139 (H) 07/02/2018 2146   GLUCOSE 107 (H) 03/02/2013 1441   BUN 23 07/02/2018 2146   BUN 15 03/02/2013 1441   CREATININE 0.61 07/02/2018 2146   CREATININE 0.70 03/02/2013 1441   CALCIUM 8.9 07/02/2018 2146   CALCIUM 8.3 (L) 03/02/2013 1441   PROT 7.8 03/02/2013 1441   ALBUMIN 3.4 03/02/2013 1441   AST 28 03/02/2013 1441   ALT 29 03/02/2013 1441   ALKPHOS 131 03/02/2013 1441   BILITOT 0.2 03/02/2013 1441   GFRNONAA >60 07/02/2018 2146   GFRNONAA >60 03/02/2013 1441   GFRAA >60 07/02/2018 2146   GFRAA >60 03/02/2013 1441     No results found.     Assessment & Plan:   1. Lymphedema I suspect that the patient's worsening lower extremity edema is related to her  standing longer than normal at work.  The patient has been working longer due to the holiday hours in addition to short staffing.  I have stressed the importance of wearing compression socks to the patient.  These should be worn daily especially if she is going to be at work standing for several hours.  The patient should also elevate her lower extremities as much as possible when she is not at work.  She should also continue use of her lymphedema pump.  I recommended wraps to help get the swelling under control to prevent further recurrence of cellulitis however this time the patient does not wish to be placed in Unna wraps.  We will have the patient return in about 4 weeks to evaluate her progress with her edema.  If her swelling has not improved we will proceed with Unna wraps.  Additionally, the patient had a hematoma in that area years ago.  If wraps or not successful may consider a CT scan of the area to ensure that there is not a recurrence of the hematoma or possible seroma. 2. Benign essential hypertension Continue antihypertensive medications as already ordered, these medications have been reviewed and there are no changes at this time.   3. Mixed hyperlipidemia Continue statin as ordered and reviewed, no changes at this time    Current Outpatient Medications on File Prior to Visit  Medication Sig Dispense Refill  . acetaminophen (TYLENOL) 500 MG chewable tablet Chew 500 mg by mouth every 6 (six) hours as needed for pain.    . calcium carbonate (OS-CAL) 600 MG TABS tablet Take 600 mg by mouth 2 (two) times daily with a meal.    . clindamycin (CLEOCIN) 300 MG capsule Take 300 mg by mouth 3 (three) times daily. For 10 days    . Cyanocobalamin (B-12) 2000 MCG TABS Take 2,000 mcg by mouth daily.    Marland Kitchen gabapentin (NEURONTIN) 100 MG capsule Take 100 mg by mouth 3 (three) times daily. One in am two in pm    . hydrochlorothiazide (MICROZIDE) 12.5 MG capsule Take 12.5 mg by mouth daily.    .  Loratadine-Pseudoephedrine ([place] ALLERCLEAR D-24HR PO) Take by mouth daily.    Marland Kitchen losartan (COZAAR) 100 MG tablet Take 100 mg by mouth daily.    . meclizine (ANTIVERT) 25 MG tablet Take 25 mg by mouth 3 (three) times daily as needed for dizziness.    . metoprolol tartrate (LOPRESSOR) 50 MG tablet Take 75 mg by mouth 2 (two) times daily.     . mometasone (NASONEX) 50 MCG/ACT nasal spray Place 2 sprays into the nose daily as needed (allergies).    . Multiple Vitamins-Minerals (MULTIVITAMIN WITH MINERALS) tablet Take 1 tablet by mouth daily.    Marland Kitchen omeprazole (PRILOSEC) 40 MG capsule Take 40 mg by mouth daily.     Marland Kitchen loratadine (CLARITIN) 10 MG tablet Take 10 mg by mouth daily. (Patient not taking: No sig reported)     No current facility-administered medications on file prior to visit.    There are no Patient Instructions on file for this visit. No follow-ups on file.   Kris Hartmann, NP

## 2020-07-21 ENCOUNTER — Ambulatory Visit (INDEPENDENT_AMBULATORY_CARE_PROVIDER_SITE_OTHER): Payer: 59 | Admitting: Vascular Surgery

## 2020-07-21 ENCOUNTER — Other Ambulatory Visit: Payer: Self-pay

## 2020-07-21 VITALS — BP 155/85 | HR 64 | Ht 59.0 in | Wt 159.0 lb

## 2020-07-21 DIAGNOSIS — K2100 Gastro-esophageal reflux disease with esophagitis, without bleeding: Secondary | ICD-10-CM

## 2020-07-21 DIAGNOSIS — I10 Essential (primary) hypertension: Secondary | ICD-10-CM

## 2020-07-21 DIAGNOSIS — I6523 Occlusion and stenosis of bilateral carotid arteries: Secondary | ICD-10-CM

## 2020-07-21 DIAGNOSIS — E782 Mixed hyperlipidemia: Secondary | ICD-10-CM

## 2020-07-21 DIAGNOSIS — I890 Lymphedema, not elsewhere classified: Secondary | ICD-10-CM

## 2020-07-21 NOTE — Progress Notes (Signed)
MRN : [id number]  Kaitlyn Nelson is a 80 y.o. (25-Mar-1941) female who presents with chief complaint of No chief complaint on file. Marland Kitchen  History of Present Illness:   The patient returns to the office for followup evaluation regarding leg swelling.  The swelling has persisted but with the lymph pump is much, much better controlled. The pain associated with swelling is essentially eliminated. There have not been any interval development of a ulcerations or wounds.  The patient denies problems with the pump, noting it is working well and the leggings are in good condition.  Since the previous visit the patient has been wearing graduated compression stockings and using the lymph pump on a routine basis and  has noted significant improvement in the lymphedema.   Patient stated the lymph pump has been a very positive factor in her care.    No outpatient medications have been marked as taking for the 07/21/20 encounter (Appointment) with Delana Meyer, Dolores Lory, MD.    Past Medical History:  Diagnosis Date  . Arthritis   . BPPV (benign paroxysmal positional vertigo)   . Carotid artery stenosis   . Environmental and seasonal allergies   . GERD (gastroesophageal reflux disease)   . HH (hiatus hernia)   . HOH (hard of hearing)   . Hypertension   . Lower extremity edema   . Neuropathy   . Osteoporosis   . Sleep apnea    CPAP    Past Surgical History:  Procedure Laterality Date  . ABDOMINAL HYSTERECTOMY    . BREAST CYST ASPIRATION Right    neg  . CATARACT EXTRACTION W/PHACO Left 06/20/2018   Procedure: CATARACT EXTRACTION PHACO AND INTRAOCULAR LENS PLACEMENT ([place]) LEFT;  Surgeon: Leandrew Koyanagi, MD;  Location: [facility] ORS;  Service: Ophthalmology;  Laterality: Left;  [place] 00:47.6 AP% 12.3 CDE 3.19 Fluid Pack Lot # [id number]  . CATARACT EXTRACTION W/PHACO Right 12/13/2018   Procedure: CATARACT EXTRACTION PHACO AND INTRAOCULAR LENS PLACEMENT ([facility])RIGHT;  Surgeon: Leandrew Koyanagi, MD;  Location: [place];  Service: Ophthalmology;  Laterality: Right;  sleep apnea  . COLONOSCOPY WITH PROPOFOL N/A 06/18/2016   Procedure: COLONOSCOPY WITH PROPOFOL;  Surgeon: Lollie Sails, MD;  Location: [facility] ENDOSCOPY;  Service: Endoscopy;  Laterality: N/A;  . EAR EXAMINATION UNDER ANESTHESIA    . ESOPHAGOGASTRODUODENOSCOPY (EGD) WITH PROPOFOL N/A 06/18/2016   Procedure: ESOPHAGOGASTRODUODENOSCOPY (EGD) WITH PROPOFOL;  Surgeon: Lollie Sails, MD;  Location: [facility] ENDOSCOPY;  Service: Endoscopy;  Laterality: N/A;    Social History Social History   Tobacco Use  . Smoking status: Never Smoker  . Smokeless tobacco: Never Used  Vaping Use  . Vaping Use: Never used  Substance Use Topics  . Alcohol use: Never  . Drug use: Never    Family History Family History  Problem Relation Age of Onset  . Breast cancer Maternal Aunt     Allergies  Allergen Reactions  . Aspirin     Stomach pain  . Augmentin [Amoxicillin-Pot Clavulanate] Diarrhea  . Compazine [Prochlorperazine Edisylate]     Pt states she does not remember   . Ibuprofen     Hurts pt's stomach   . Triamterene   . Sulfa Antibiotics Rash     REVIEW OF SYSTEMS (Negative unless checked)  Constitutional: [] Weight loss  [] Fever  [] Chills Cardiac: [] Chest pain   [] Chest pressure   [] Palpitations   [] Shortness of breath when laying flat   [] Shortness of breath with exertion. Vascular:  [] Pain in legs with  walking   [] Pain in legs at rest  [] History of DVT   [] Phlebitis   [] Swelling in legs   [] Varicose veins   [] Non-healing ulcers Pulmonary:   [] Uses home oxygen   [] Productive cough   [] Hemoptysis   [] Wheeze  [] COPD   [] Asthma Neurologic:  [] Dizziness   [] Seizures   [] History of stroke   [] History of TIA  [] Aphasia   [] Vissual changes   [] Weakness or numbness in arm   [] Weakness or numbness in leg Musculoskeletal:   [] Joint swelling   [] Joint pain   [] Low back pain Hematologic:  [] Easy bruising   [] Easy bleeding   [] Hypercoagulable state   [] Anemic Gastrointestinal:  [] Diarrhea   [] Vomiting  [x] Gastroesophageal reflux/heartburn   [] Difficulty swallowing. Genitourinary:  [] Chronic kidney disease   [] Difficult urination  [] Frequent urination   [] Blood in urine Skin:  [] Rashes   [] Ulcers  Psychological:  [] History of anxiety   []  History of major depression.  Physical Examination  There were no vitals filed for this visit. There is no height or weight on file to calculate BMI. Gen: WD/WN, NAD Head: /AT, No temporalis wasting.  Ear/Nose/Throat: Hearing grossly intact, nares w/o erythema or drainage Eyes: PER, EOMI, sclera nonicteric.  Neck: Supple, no large masses.   Pulmonary:  Good air movement, no audible wheezing bilaterally, no use of accessory muscles.  Cardiac: RRR, no JVD Vascular: scattered varicosities present bilaterally.  Mild venous stasis changes to the legs bilaterally.  3+ soft pitting edema. Vessel Right Left  Radial Palpable Palpable  Gastrointestinal: Non-distended. No guarding/no peritoneal signs.  Musculoskeletal: M/S 5/5 throughout.  No deformity or atrophy.  Neurologic: CN 2-12 intact. Symmetrical.  Speech is fluent. Motor exam as listed above. Psychiatric: Judgment intact, Mood & affect appropriate for pt's clinical situation. Dermatologic: No rashes or ulcers noted.  No changes consistent with cellulitis. Lymph : + lichenification or skin changes of chronic lymphedema.  CBC Lab Results  Component Value Date   WBC 15.0 (H) 07/02/2018   HGB 13.6 07/02/2018   HCT 42.8 07/02/2018   MCV 92.6 07/02/2018   PLT 226 07/02/2018    BMET    Component Value Date/Time   NA 135 07/02/2018 2146   NA 130 (L) 03/02/2013 1441   K 4.1 07/02/2018 2146   K 3.8 03/02/2013 1441   CL 100 07/02/2018 2146   CL 98 03/02/2013 1441   CO2 26 07/02/2018 2146   CO2 26 03/02/2013 1441   GLUCOSE 139 (H) 07/02/2018 2146   GLUCOSE 107 (H) 03/02/2013 1441   BUN 23  07/02/2018 2146   BUN 15 03/02/2013 1441   CREATININE 0.61 07/02/2018 2146   CREATININE 0.70 03/02/2013 1441   CALCIUM 8.9 07/02/2018 2146   CALCIUM 8.3 (L) 03/02/2013 1441   GFRNONAA >60 07/02/2018 2146   GFRNONAA >60 03/02/2013 1441   GFRAA >60 07/02/2018 2146   GFRAA >60 03/02/2013 1441   CrCl cannot be calculated (Patient's most recent lab result is older than the maximum 21 days allowed.).  COAG No results found for: INR, PROTIME  Radiology No results found.   Assessment/Plan 1. Lymphedema  No surgery or intervention at this point in time.    I have reviewed my discussion with the patient regarding lymphedema and why it  causes symptoms.  Patient will continue wearing graduated compression stockings class 1 (20-30 mmHg) on a daily basis a prescription was given. The patient is reminded to put the stockings on first thing in the morning and removing them in  the evening. The patient is instructed specifically not to sleep in the stockings.   In addition, behavioral modification throughout the day will be continued.  This will include frequent elevation (such as in a recliner), use of over the counter pain medications as needed and exercise such as walking.  I have reviewed systemic causes for chronic edema such as liver, kidney and cardiac etiologies and there does not appear to be any significant changes in these organ systems over the past year.  The patient is under the impression that these organ systems are all stable and unchanged.    The patient will continue aggressive use of the  lymph pump.  This will continue to improve the edema control and prevent sequela such as ulcers and infections.   The patient will follow-up with me on an annual basis.    2. Bilateral carotid artery stenosis Recommend:  Given the patient's asymptomatic subcritical stenosis no further invasive testing or surgery at this time.  Continue antiplatelet therapy as prescribed Continue  management of CAD, HTN and Hyperlipidemia Healthy heart diet,  encouraged exercise at least 4 times per week Follow up in 12 months with duplex ultrasound and physical exam   3. Benign essential hypertension Continue antihypertensive medications as already ordered, these medications have been reviewed and there are no changes at this time.   4. Gastroesophageal reflux disease with esophagitis without hemorrhage Continue PPI as already ordered, this medication has been reviewed and there are no changes at this time.  Avoidence of caffeine and alcohol  Moderate elevation of the head of the bed   5. Mixed hyperlipidemia Continue statin as ordered and reviewed, no changes at this time    Hortencia Pilar, MD  07/21/2020 1:54 PM

## 2020-07-25 ENCOUNTER — Encounter (INDEPENDENT_AMBULATORY_CARE_PROVIDER_SITE_OTHER): Payer: Self-pay | Admitting: Vascular Surgery

## 2020-07-25 DIAGNOSIS — I890 Lymphedema, not elsewhere classified: Secondary | ICD-10-CM | POA: Insufficient documentation

## 2020-10-24 ENCOUNTER — Other Ambulatory Visit: Payer: Self-pay | Admitting: Internal Medicine

## 2020-10-24 DIAGNOSIS — K5900 Constipation, unspecified: Secondary | ICD-10-CM

## 2020-10-24 DIAGNOSIS — R140 Abdominal distension (gaseous): Secondary | ICD-10-CM

## 2020-11-07 ENCOUNTER — Other Ambulatory Visit: Payer: Self-pay | Admitting: Internal Medicine

## 2020-11-10 ENCOUNTER — Other Ambulatory Visit: Payer: Self-pay

## 2020-11-10 ENCOUNTER — Ambulatory Visit
Admission: RE | Admit: 2020-11-10 | Discharge: 2020-11-10 | Disposition: A | Discharge status: Home / Self Care | Payer: 59 | Source: Ambulatory Visit | Attending: Internal Medicine | Admitting: Internal Medicine

## 2020-11-10 DIAGNOSIS — R140 Abdominal distension (gaseous): Secondary | ICD-10-CM | POA: Diagnosis not present

## 2020-11-10 DIAGNOSIS — K5900 Constipation, unspecified: Secondary | ICD-10-CM | POA: Insufficient documentation

## 2020-12-02 ENCOUNTER — Other Ambulatory Visit: Payer: Self-pay | Admitting: Internal Medicine

## 2020-12-02 DIAGNOSIS — Z1231 Encounter for screening mammogram for malignant neoplasm of breast: Secondary | ICD-10-CM

## 2020-12-08 ENCOUNTER — Other Ambulatory Visit: Payer: Self-pay

## 2020-12-08 ENCOUNTER — Ambulatory Visit
Admission: RE | Admit: 2020-12-08 | Discharge: 2020-12-08 | Disposition: A | Discharge status: Home / Self Care | Payer: 59 | Source: Ambulatory Visit | Attending: Internal Medicine | Admitting: Internal Medicine

## 2020-12-08 DIAGNOSIS — Z1231 Encounter for screening mammogram for malignant neoplasm of breast: Secondary | ICD-10-CM

## 2021-01-18 NOTE — Progress Notes (Signed)
MRN : [id number]  Kaitlyn Nelson is a 80 y.o. (07/30/1940) female who presents with chief complaint of check on my leg swelling.  History of Present Illness:  The patient returns to the office for followup evaluation regarding leg swelling.  The swelling has persisted but with the lymph pump is much, much better controlled. The pain associated with swelling is essentially eliminated. There have not been any interval development of a ulcerations or wounds.   The patient denies problems with the pump, noting it is working well and the leggings are in good condition.   Since the previous visit the patient has been wearing graduated compression stockings and using the lymph pump on a routine basis and  has noted improvement in the lymphedema.    Patient stated the lymph pump has been a very positive factor in her care.   The patient is also followed for carotid stenosis. The carotid stenosis followed by ultrasound.   The patient denies amaurosis fugax. There is no recent history of TIA symptoms or focal motor deficits. There is no prior documented CVA.  The patient is taking enteric-coated aspirin 81 mg daily.  The patient has a history of coronary artery disease, no recent episodes of angina or shortness of breath. The patient denies PAD or claudication symptoms. There is a history of hyperlipidemia which is being treated with a statin.       No outpatient medications have been marked as taking for the 01/19/21 encounter (Appointment) with Delana Meyer, Dolores Lory, MD.    Past Medical History:  Diagnosis Date   Arthritis    BPPV (benign paroxysmal positional vertigo)    Carotid artery stenosis    Environmental and seasonal allergies    GERD (gastroesophageal reflux disease)    HH (hiatus hernia)    HOH (hard of hearing)    Hypertension    Lower extremity edema    Neuropathy    Osteoporosis    Sleep apnea    CPAP    Past Surgical History:  Procedure Laterality Date    ABDOMINAL HYSTERECTOMY     BREAST CYST ASPIRATION Right    neg   CATARACT EXTRACTION W/PHACO Left 06/20/2018   Procedure: CATARACT EXTRACTION PHACO AND INTRAOCULAR LENS PLACEMENT ([place]) LEFT;  Surgeon: Leandrew Koyanagi, MD;  Location: [facility] ORS;  Service: Ophthalmology;  Laterality: Left;  [place] 00:47.6 AP% 12.3 CDE 3.19 Fluid Pack Lot # [id number]   CATARACT EXTRACTION W/PHACO Right 12/13/2018   Procedure: CATARACT EXTRACTION PHACO AND INTRAOCULAR LENS PLACEMENT (I[facility])RIGHT;  Surgeon: Leandrew Koyanagi, MD;  Location: [place];  Service: Ophthalmology;  Laterality: Right;  sleep apnea   COLONOSCOPY WITH PROPOFOL N/A 06/18/2016   Procedure: COLONOSCOPY WITH PROPOFOL;  Surgeon: Lollie Sails, MD;  Location: [facility] ENDOSCOPY;  Service: Endoscopy;  Laterality: N/A;   EAR EXAMINATION UNDER ANESTHESIA     ESOPHAGOGASTRODUODENOSCOPY (EGD) WITH PROPOFOL N/A 06/18/2016   Procedure: ESOPHAGOGASTRODUODENOSCOPY (EGD) WITH PROPOFOL;  Surgeon: Lollie Sails, MD;  Location: [facility] ENDOSCOPY;  Service: Endoscopy;  Laterality: N/A;    Social History Social History   Tobacco Use   Smoking status: Never   Smokeless tobacco: Never  Vaping Use   Vaping Use: Never used  Substance Use Topics   Alcohol use: Never   Drug use: Never    Family History Family History  Problem Relation Age of Onset   Breast cancer Maternal Aunt     Allergies  Allergen Reactions   Aspirin     Stomach pain  Augmentin [Amoxicillin-Pot Clavulanate] Diarrhea   Compazine [Prochlorperazine Edisylate]     Pt states she does not remember    Ibuprofen     Hurts pt's stomach    Triamterene    Sulfa Antibiotics Rash     REVIEW OF SYSTEMS (Negative unless checked)  Constitutional: '[]'$ Weight loss  '[]'$ Fever  '[]'$ Chills Cardiac: '[]'$ Chest pain   '[]'$ Chest pressure   '[]'$ Palpitations   '[]'$ Shortness of breath when laying flat   '[]'$ Shortness of breath with exertion. Vascular:  '[]'$ Pain in legs with walking   '[]'$ Pain in legs at  rest  '[]'$ History of DVT   '[]'$ Phlebitis   '[x]'$ Swelling in legs   '[]'$ Varicose veins   '[]'$ Non-healing ulcers Pulmonary:   '[]'$ Uses home oxygen   '[]'$ Productive cough   '[]'$ Hemoptysis   '[]'$ Wheeze  '[]'$ COPD   '[]'$ Asthma Neurologic:  '[]'$ Dizziness   '[]'$ Seizures   '[]'$ History of stroke   '[]'$ History of TIA  '[]'$ Aphasia   '[]'$ Vissual changes   '[]'$ Weakness or numbness in arm   '[]'$ Weakness or numbness in leg Musculoskeletal:   '[]'$ Joint swelling   '[]'$ Joint pain   '[]'$ Low back pain Hematologic:  '[]'$ Easy bruising  '[]'$ Easy bleeding   '[]'$ Hypercoagulable state   '[]'$ Anemic Gastrointestinal:  '[]'$ Diarrhea   '[]'$ Vomiting  '[]'$ Gastroesophageal reflux/heartburn   '[]'$ Difficulty swallowing. Genitourinary:  '[]'$ Chronic kidney disease   '[]'$ Difficult urination  '[]'$ Frequent urination   '[]'$ Blood in urine Skin:  '[]'$ Rashes   '[]'$ Ulcers  Psychological:  '[]'$ History of anxiety   '[]'$  History of major depression.  Physical Examination  There were no vitals filed for this visit. There is no height or weight on file to calculate BMI. Gen: WD/WN, NAD Head: [place]/AT, No temporalis wasting.  Ear/Nose/Throat: Hearing grossly intact, nares w/o erythema or drainage, pinna without lesions Eyes: PER, EOMI, sclera nonicteric.  Neck: Supple, no gross masses.  No JVD.  Pulmonary:  Good air movement, no audible wheezing, no use of accessory muscles.  Cardiac: RRR, precordium not hyperdynamic. Vascular:  scattered varicosities present bilaterally.  Mild venous stasis changes to the legs bilaterally.  2+ soft pitting edema well-healed incisional scar from the right leg status post a removal of a skin cancer.  Abrasion of the left calf area that is healing nicely Vessel Right Left  Radial Palpable Palpable  Gastrointestinal: soft, non-distended. No guarding/no peritoneal signs.  Musculoskeletal: M/S 5/5 throughout.  No deformity.  Neurologic: CN 2-12 intact. Pain and light touch intact in extremities.  Symmetrical.  Speech is fluent. Motor exam as listed above. Psychiatric: Judgment intact, Mood  & affect appropriate for pt's clinical situation. Dermatologic: Venous rashes no ulcers noted.  No changes consistent with cellulitis. Lymph : No lichenification or skin changes of chronic lymphedema.  CBC Lab Results  Component Value Date   WBC 15.0 (H) 07/02/2018   HGB 13.6 07/02/2018   HCT 42.8 07/02/2018   MCV 92.6 07/02/2018   PLT 226 07/02/2018    BMET    Component Value Date/Time   NA 135 07/02/2018 2146   NA 130 (L) 03/02/2013 1441   K 4.1 07/02/2018 2146   K 3.8 03/02/2013 1441   CL 100 07/02/2018 2146   CL 98 03/02/2013 1441   CO2 26 07/02/2018 2146   CO2 26 03/02/2013 1441   GLUCOSE 139 (H) 07/02/2018 2146   GLUCOSE 107 (H) 03/02/2013 1441   BUN 23 07/02/2018 2146   BUN 15 03/02/2013 1441   CREATININE 0.61 07/02/2018 2146   CREATININE 0.70 03/02/2013 1441   CALCIUM 8.9 07/02/2018 2146   CALCIUM 8.3 (L) 03/02/2013 1441  GFRNONAA >60 07/02/2018 2146   GFRNONAA >60 03/02/2013 1441   GFRAA >60 07/02/2018 2146   GFRAA >60 03/02/2013 1441   CrCl cannot be calculated (Patient's most recent lab result is older than the maximum 21 days allowed.).  COAG No results found for: INR, PROTIME  Radiology No results found.   Assessment/Plan 1. Lymphedema  No surgery or intervention at this point in time.    I have reviewed my discussion with the patient regarding lymphedema and why it  causes symptoms.  Patient will continue wearing graduated compression stockings class 1 (20-30 mmHg) on a daily basis a prescription was given. The patient is reminded to put the stockings on first thing in the morning and removing them in the evening. The patient is instructed specifically not to sleep in the stockings.   In addition, behavioral modification throughout the day will be continued.  This will include frequent elevation (such as in a recliner), use of over the counter pain medications as needed and exercise such as walking.  I have reviewed systemic causes for chronic  edema such as liver, kidney and cardiac etiologies and there does not appear to be any significant changes in these organ systems over the past year.  The patient is under the impression that these organ systems are all stable and unchanged.    The patient will continue aggressive use of the  lymph pump.  This will continue to improve the edema control and prevent sequela such as ulcers and infections.   The patient will follow-up with me on an annual basis.    2. Bilateral carotid artery stenosis Recommend:  Given the patient's asymptomatic subcritical stenosis no further invasive testing or surgery at this time.  Continue antiplatelet therapy as prescribed Continue management of CAD, HTN and Hyperlipidemia Healthy heart diet,  encouraged exercise at least 4 times per week Follow up in 12 months with duplex ultrasound and physical exam   - VAS US CAROTID; Future  3. Benign essential hypertension Continue antihypertensive medications as already ordered, these medications have been reviewed and there are no changes at this time.   4. Gastroesophageal reflux disease with esophagitis without hemorrhage Continue PPI as already ordered, this medication has been reviewed and there are no changes at this time.  Avoidence of caffeine and alcohol  Moderate elevation of the head of the bed    5. Mixed hyperlipidemia Continue statin as ordered and reviewed, no changes at this time     Hortencia Pilar, MD  01/18/2021 2:06 PM

## 2021-01-19 ENCOUNTER — Other Ambulatory Visit: Payer: Self-pay

## 2021-01-19 ENCOUNTER — Ambulatory Visit (INDEPENDENT_AMBULATORY_CARE_PROVIDER_SITE_OTHER): Payer: 59 | Admitting: Vascular Surgery

## 2021-01-19 VITALS — BP 186/100 | HR 66 | Ht 59.0 in | Wt 156.0 lb

## 2021-01-19 DIAGNOSIS — I10 Essential (primary) hypertension: Secondary | ICD-10-CM

## 2021-01-19 DIAGNOSIS — I6523 Occlusion and stenosis of bilateral carotid arteries: Secondary | ICD-10-CM

## 2021-01-19 DIAGNOSIS — I890 Lymphedema, not elsewhere classified: Secondary | ICD-10-CM

## 2021-01-19 DIAGNOSIS — K2100 Gastro-esophageal reflux disease with esophagitis, without bleeding: Secondary | ICD-10-CM

## 2021-01-19 DIAGNOSIS — E782 Mixed hyperlipidemia: Secondary | ICD-10-CM

## 2021-01-21 ENCOUNTER — Encounter (INDEPENDENT_AMBULATORY_CARE_PROVIDER_SITE_OTHER): Payer: Self-pay | Admitting: Vascular Surgery

## 2021-04-17 ENCOUNTER — Encounter: Payer: Self-pay | Admitting: *Deleted

## 2021-04-19 NOTE — H&P (Signed)
Pre-Procedure H&P   Patient ID: Kaitlyn Nelson is a 80 y.o. female.  Gastroenterology Provider: Annamaria Helling, DO  Referring Provider: Stephens November, NP PCP: Tracie Harrier, MD  Date: 04/20/2021  HPI Ms. Kaitlyn Nelson is a 80 y.o. female who presents today for Esophagogastroduodenoscopy and Colonoscopy for dysphagia, change in bowel habits, bloating, constipation.  Patient has had irregular bowel movements with constipation and feelings of incomplete evacuation. No blood noted.  Has also noted some dysphagia, which has improved after increasing omeprazole 40 mg to twice daily. Mostly notes "sticking" and points to her upper throat above her suprasternal notch. No odynophagia.  S/p hysterectomy, cr 0.7, hgb 12.7 mcv 88 plt 246. 10/2020 ct normal aside from noting possible cirrhotic changes to liver 05/2016 EGD and colonoscopy- tortuous colon, no polyps; random colon bx negative. Diverticulosis noted. Pt with reflux esophagitis and gastric polyps; negative for HP and BE. Submucosal papule in greater curvature/antrum, negative bx.  Past Medical History:  Diagnosis Date   Arthritis    Benign fundic gland polyps of stomach    BPPV (benign paroxysmal positional vertigo)    Carotid artery stenosis    Environmental and seasonal allergies    Esophagitis    GERD (gastroesophageal reflux disease)    Heart murmur    HH (hiatus hernia)    HOH (hard of hearing)    Hypertension    Lower extremity edema    Neuropathy    Osteoporosis    Seasonal allergies    Sleep apnea    CPAP    Past Surgical History:  Procedure Laterality Date   ABDOMINAL HYSTERECTOMY     BREAST CYST ASPIRATION Right    neg   CATARACT EXTRACTION W/PHACO Left 06/20/2018   Procedure: CATARACT EXTRACTION PHACO AND INTRAOCULAR LENS PLACEMENT ([place]) LEFT;  Surgeon: Leandrew Koyanagi, MD;  Location: [facility] ORS;  Service: Ophthalmology;  Laterality: Left;  [place] 00:47.6 AP% 12.3 CDE  3.19 Fluid Pack Lot # [id number]   CATARACT EXTRACTION W/PHACO Right 12/13/2018   Procedure: CATARACT EXTRACTION PHACO AND INTRAOCULAR LENS PLACEMENT ([facility])RIGHT;  Surgeon: Leandrew Koyanagi, MD;  Location: [place];  Service: Ophthalmology;  Laterality: Right;  sleep apnea   COLONOSCOPY WITH PROPOFOL N/A 06/18/2016   Procedure: COLONOSCOPY WITH PROPOFOL;  Surgeon: Lollie Sails, MD;  Location: [facility] ENDOSCOPY;  Service: Endoscopy;  Laterality: N/A;   EAR EXAMINATION UNDER ANESTHESIA     ESOPHAGOGASTRODUODENOSCOPY (EGD) WITH PROPOFOL N/A 06/18/2016   Procedure: ESOPHAGOGASTRODUODENOSCOPY (EGD) WITH PROPOFOL;  Surgeon: Lollie Sails, MD;  Location: [facility] ENDOSCOPY;  Service: Endoscopy;  Laterality: N/A;   EYE SURGERY     INNER EAR SURGERY     tympanoplasty with mastoidectomy with ossicular chain reconstruction Right     Family History No h/o GI disease or malignancy  Review of Systems  Constitutional:  Negative for activity change, appetite change, fatigue, fever and unexpected weight change.  HENT:  Positive for trouble swallowing. Negative for voice change.   Respiratory:  Negative for shortness of breath and wheezing.   Cardiovascular:  Negative for chest pain and palpitations.  Gastrointestinal:  Positive for constipation. Negative for abdominal distention, abdominal pain, anal bleeding, blood in stool, diarrhea, nausea, rectal pain and vomiting.       Bloating, incomplete bowel evacuation  Musculoskeletal:  Negative for arthralgias and myalgias.  Skin:  Negative for color change and pallor.  Neurological:  Negative for dizziness, syncope and weakness.  Psychiatric/Behavioral:  Negative for confusion.   All other  systems reviewed and are negative.   Medications No current facility-administered medications on file prior to encounter.   Current Outpatient Medications on File Prior to Encounter  Medication Sig Dispense Refill   acetaminophen (TYLENOL) 500 MG  chewable tablet Chew 500 mg by mouth every 6 (six) hours as needed for pain.     calcium carbonate (OS-CAL) 600 MG TABS tablet Take 600 mg by mouth 2 (two) times daily with a meal.     Cyanocobalamin (B-12) 2000 MCG TABS Take 2,000 mcg by mouth daily.     gabapentin (NEURONTIN) 100 MG capsule Take 100 mg by mouth 3 (three) times daily. One in am two in pm     hydrochlorothiazide (MICROZIDE) 12.5 MG capsule Take 12.5 mg by mouth daily.     loratadine (CLARITIN) 10 MG tablet Take 10 mg by mouth daily.     Loratadine-Pseudoephedrine ([place] ALLERCLEAR D-24HR PO) Take by mouth daily.     losartan (COZAAR) 100 MG tablet Take 100 mg by mouth daily.     meclizine (ANTIVERT) 25 MG tablet Take 25 mg by mouth 3 (three) times daily as needed for dizziness.     metoprolol tartrate (LOPRESSOR) 50 MG tablet Take 75 mg by mouth 2 (two) times daily.      mometasone (NASONEX) 50 MCG/ACT nasal spray Place 2 sprays into the nose daily as needed (allergies).     Multiple Vitamins-Minerals (MULTIVITAMIN WITH MINERALS) tablet Take 1 tablet by mouth daily.     omeprazole (PRILOSEC) 40 MG capsule Take 40 mg by mouth daily.      clindamycin (CLEOCIN) 300 MG capsule Take 300 mg by mouth 3 (three) times daily. For 10 days     furosemide (LASIX) 20 MG tablet Take by mouth. (Patient not taking: Reported on 04/20/2021)      Pertinent medications related to GI and procedure were reviewed by me with the patient prior to the procedure   Current Facility-Administered Medications:    0.9 %  sodium chloride infusion, , Intravenous, Continuous, Annamaria Helling, DO, Last Rate: 20 mL/hr at 04/20/21 0277, New Bag at 04/20/21 0721      Allergies  Allergen Reactions   Aspirin     Stomach pain   Augmentin [Amoxicillin-Pot Clavulanate] Diarrhea   Compazine [Prochlorperazine Edisylate]     Pt states she does not remember    Ibuprofen     Hurts pt's stomach    Triamterene    Sulfa Antibiotics Rash   Allergies were reviewed  by me prior to the procedure  Objective    Vitals:   04/20/21 0703  BP: (!) 175/73  Pulse: 61  Resp: 18  Temp: (!) 97.3 F (36.3 C)  TempSrc: Temporal  SpO2: 98%  Weight: 71.2 kg  Height: 4\' 11"  (1.499 m)     Physical Exam Vitals and nursing note reviewed.  Constitutional:      General: She is not in acute distress.    Appearance: Normal appearance. She is obese. She is not ill-appearing, toxic-appearing or diaphoretic.  HENT:     Head: Normocephalic and atraumatic.     Nose: Nose normal.     Mouth/Throat:     Mouth: Mucous membranes are moist.     Pharynx: Oropharynx is clear.  Eyes:     General: No scleral icterus.    Extraocular Movements: Extraocular movements intact.  Cardiovascular:     Rate and Rhythm: Normal rate and regular rhythm.     Heart sounds: Normal heart sounds. No  murmur heard.   No friction rub. No gallop.  Pulmonary:     Effort: Pulmonary effort is normal. No respiratory distress.     Breath sounds: Normal breath sounds. No wheezing, rhonchi or rales.  Abdominal:     General: Bowel sounds are normal. There is no distension.     Palpations: Abdomen is soft.     Tenderness: There is no abdominal tenderness. There is no guarding or rebound.  Musculoskeletal:     Cervical back: Neck supple.     Right lower leg: No edema.     Left lower leg: No edema.     Comments: Rheumatoid changes of bilateral hands  Skin:    General: Skin is warm and dry.     Coloration: Skin is not jaundiced or pale.  Neurological:     General: No focal deficit present.     Mental Status: She is alert and oriented to person, place, and time. Mental status is at baseline.  Psychiatric:        Mood and Affect: Mood normal.        Behavior: Behavior normal.        Thought Content: Thought content normal.        Judgment: Judgment normal.     Assessment:  Ms. Kaitlyn Nelson is a 80 y.o. female  who presents today for Esophagogastroduodenoscopy and Colonoscopy for  dysphagia, change in bowel habits, bloating, constipation.  Plan:  Esophagogastroduodenoscopy and Colonoscopy with possible intervention today  Esophagogastroduodenoscopy and colonoscopy with possible biopsy, control of bleeding, polypectomy, and interventions as necessary has been discussed with the patient/patient representative. Informed consent was obtained from the patient/patient representative after explaining the indication, nature, and risks of the procedure including but not limited to death, bleeding, perforation, missed neoplasm/lesions, cardiorespiratory compromise, and reaction to medications. Opportunity for questions was given and appropriate answers were provided. Patient/patient representative has verbalized understanding is amenable to undergoing the procedure.   Annamaria Helling, DO  [facility]  Portions of the record may have been created with voice recognition software. Occasional wrong-word or 'sound-a-like' substitutions may have occurred due to the inherent limitations of voice recognition software.  Read the chart carefully and recognize, using context, where substitutions may have occurred.

## 2021-04-20 ENCOUNTER — Encounter
Admission: RE | Disposition: A | Discharge status: Home / Self Care | Payer: Self-pay | Source: Ambulatory Visit | Attending: Gastroenterology

## 2021-04-20 ENCOUNTER — Ambulatory Visit: Payer: 59 | Admitting: Certified Registered Nurse Anesthetist

## 2021-04-20 ENCOUNTER — Other Ambulatory Visit: Payer: Self-pay

## 2021-04-20 ENCOUNTER — Encounter: Payer: Self-pay | Admitting: *Deleted

## 2021-04-20 ENCOUNTER — Ambulatory Visit
Admission: RE | Admit: 2021-04-20 | Discharge: 2021-04-20 | Disposition: A | Discharge status: Home / Self Care | Payer: 59 | Source: Ambulatory Visit | Attending: Gastroenterology | Admitting: Gastroenterology

## 2021-04-20 DIAGNOSIS — D120 Benign neoplasm of cecum: Secondary | ICD-10-CM | POA: Insufficient documentation

## 2021-04-20 DIAGNOSIS — K317 Polyp of stomach and duodenum: Secondary | ICD-10-CM | POA: Insufficient documentation

## 2021-04-20 DIAGNOSIS — K640 First degree hemorrhoids: Secondary | ICD-10-CM | POA: Diagnosis not present

## 2021-04-20 DIAGNOSIS — K5904 Chronic idiopathic constipation: Secondary | ICD-10-CM | POA: Diagnosis not present

## 2021-04-20 DIAGNOSIS — R1310 Dysphagia, unspecified: Secondary | ICD-10-CM | POA: Diagnosis not present

## 2021-04-20 DIAGNOSIS — K224 Dyskinesia of esophagus: Secondary | ICD-10-CM | POA: Insufficient documentation

## 2021-04-20 HISTORY — PX: ESOPHAGOGASTRODUODENOSCOPY (EGD) WITH PROPOFOL: SHX5813

## 2021-04-20 HISTORY — PX: COLONOSCOPY WITH PROPOFOL: SHX5780

## 2021-04-20 HISTORY — DX: Esophagitis, unspecified without bleeding: K20.90

## 2021-04-20 HISTORY — DX: Benign neoplasm of stomach: D13.1

## 2021-04-20 HISTORY — DX: Other seasonal allergic rhinitis: J30.2

## 2021-04-20 HISTORY — DX: Cardiac murmur, unspecified: R01.1

## 2021-04-20 SURGERY — COLONOSCOPY WITH PROPOFOL
Anesthesia: General

## 2021-04-20 MED ORDER — PHENYLEPHRINE HCL-NACL 20-0.9 MG/250ML-% IV SOLN
INTRAVENOUS | Status: AC
  Filled 2021-04-20: qty 250

## 2021-04-20 MED ORDER — DEXMEDETOMIDINE (PRECEDEX) IN NS 20 MCG/5ML (4 MCG/ML) IV SYRINGE
PREFILLED_SYRINGE | INTRAVENOUS | Status: DC | PRN
  Administered 2021-04-20: 4 ug via INTRAVENOUS

## 2021-04-20 MED ORDER — LIDOCAINE HCL (CARDIAC) PF 100 MG/5ML IV SOSY
PREFILLED_SYRINGE | INTRAVENOUS | Status: DC | PRN
  Administered 2021-04-20: 50 mg via INTRAVENOUS

## 2021-04-20 MED ORDER — SODIUM CHLORIDE 0.9 % IV SOLN: INTRAVENOUS | Status: DC

## 2021-04-20 MED ORDER — LIDOCAINE HCL (PF) 2 % IJ SOLN
INTRAMUSCULAR | Status: AC
  Filled 2021-04-20: qty 5

## 2021-04-20 MED ORDER — PROPOFOL 500 MG/50ML IV EMUL
INTRAVENOUS | Status: AC
  Filled 2021-04-20: qty 50

## 2021-04-20 MED ORDER — PROPOFOL 10 MG/ML IV BOLUS
INTRAVENOUS | Status: DC | PRN
  Administered 2021-04-20: 50 mg via INTRAVENOUS

## 2021-04-20 MED ORDER — PROPOFOL 500 MG/50ML IV EMUL
INTRAVENOUS | Status: DC | PRN
  Administered 2021-04-20: 125 ug/kg/min via INTRAVENOUS

## 2021-04-20 MED ORDER — GLYCOPYRROLATE 0.2 MG/ML IJ SOLN
INTRAMUSCULAR | Status: AC
  Filled 2021-04-20: qty 1

## 2021-04-20 MED ORDER — EPHEDRINE SULFATE 50 MG/ML IJ SOLN
INTRAMUSCULAR | Status: DC | PRN
  Administered 2021-04-20: 5 mg via INTRAVENOUS

## 2021-04-20 NOTE — Anesthesia Procedure Notes (Signed)
Date/Time: 04/20/2021 7:45 AM Performed by: Johnna Acosta, CRNA Pre-anesthesia Checklist: Patient identified, Emergency Drugs available, Suction available, Patient being monitored and Timeout performed Patient Re-evaluated:Patient Re-evaluated prior to induction Oxygen Delivery Method: Supernova nasal CPAP Preoxygenation: Pre-oxygenation with 100% oxygen Induction Type: IV induction

## 2021-04-20 NOTE — Anesthesia Preprocedure Evaluation (Signed)
Anesthesia Evaluation  Patient identified by MRN, date of birth, ID band Patient awake    Reviewed: Allergy & Precautions, H&P , NPO status , Patient's Chart, lab work & pertinent test results, reviewed documented beta blocker date and time   Airway Mallampati: II  TM Distance: >3 FB Neck ROM: Full    Dental no notable dental hx.    Pulmonary sleep apnea ,    Pulmonary exam normal        Cardiovascular hypertension, Pt. on medications and Pt. on home beta blockers Normal cardiovascular exam+ Valvular Problems/Murmurs      Neuro/Psych negative neurological ROS  negative psych ROS   GI/Hepatic Neg liver ROS, hiatal hernia, Bowel prep,GERD  Medicated,  Endo/Other  negative endocrine ROS  Renal/GU negative Renal ROS  negative genitourinary   Musculoskeletal  (+) Arthritis ,   Abdominal   Peds negative pediatric ROS (+)  Hematology negative hematology ROS (+)   Anesthesia Other Findings . Benign fundic gland polyps of stomach 06/18/2016  . Breast cyst  . Cataracts, bilateral  . Esophagitis  . GERD (gastroesophageal reflux disease)  . Hiatal hernia  . Hypertension  . Lower leg edema  takes neurontin and HCTZ  . Murmur, heart, unspecified  . Osteoarthritis  . Reflux esophagitis 06/18/2016  . Seasonal allergies  . Tortuous colon 06/18/2016    Reproductive/Obstetrics negative OB ROS                            Anesthesia Physical Anesthesia Plan  ASA: 3  Anesthesia Plan: General   Post-op Pain Management:    Induction: Intravenous  PONV Risk Score and Plan: 2 and TIVA and Midazolam  Airway Management Planned: Natural Airway and Nasal Cannula  Additional Equipment:   Intra-op Plan:   Post-operative Plan:   Informed Consent: I have reviewed the patients History and Physical, chart, labs and discussed the procedure including the risks, benefits and alternatives for the proposed  anesthesia with the patient or authorized representative who has indicated his/her understanding and acceptance.       Plan Discussed with: CRNA, Anesthesiologist and Surgeon  Anesthesia Plan Comments:         Anesthesia Quick Evaluation

## 2021-04-20 NOTE — Op Note (Signed)
[facility] Gastroenterology Patient Name: Zehava Nelson Procedure Date: 04/20/2021 7:38 AM MRN: [id number] Account #: [id number] Date of Birth: February 25, 1941 Admit Type: Outpatient Age: 80 Room: [facility] ENDO ROOM 2 Gender: Female Note Status: Finalized Instrument Name: Colonoscope [id number] Procedure:             Colonoscopy Indications:           Chronic idiopathic constipation Providers:             Annamaria Helling DO, DO Referring MD:          Tracie Harrier, MD (Referring MD) Medicines:             Monitored Anesthesia Care Complications:         No immediate complications. Estimated blood loss:                         Minimal. Procedure:             Pre-Anesthesia Assessment:                        - Prior to the procedure, a History and Physical was                         performed, and patient medications and allergies were                         reviewed. The patient is competent. The risks and                         benefits of the procedure and the sedation options and                         risks were discussed with the patient. All questions                         were answered and informed consent was obtained.                         Patient identification and proposed procedure were                         verified by the physician, the nurse, the anesthetist                         and the technician in the endoscopy suite. Mental                         Status Examination: alert and oriented. Airway                         Examination: normal oropharyngeal airway and neck                         mobility. Respiratory Examination: clear to                         auscultation. CV Examination: RRR, no murmurs, no S3  or S4. Prophylactic Antibiotics: The patient does not                         require prophylactic antibiotics. Prior                         Anticoagulants: The patient has taken no previous                          anticoagulant or antiplatelet agents. ASA Grade                         Assessment: III - A patient with severe systemic                         disease. After reviewing the risks and benefits, the                         patient was deemed in satisfactory condition to                         undergo the procedure. The anesthesia plan was to use                         monitored anesthesia care (MAC). Immediately prior to                         administration of medications, the patient was                         re-assessed for adequacy to receive sedatives. The                         heart rate, respiratory rate, oxygen saturations,                         blood pressure, adequacy of pulmonary ventilation, and                         response to care were monitored throughout the                         procedure. The physical status of the patient was                         re-assessed after the procedure.                        After obtaining informed consent, the colonoscope was                         passed under direct vision. Throughout the procedure,                         the patient's blood pressure, pulse, and oxygen                         saturations were monitored continuously. The  Colonoscope was introduced through the anus and                         advanced to the the terminal ileum, with                         identification of the appendiceal orifice and IC                         valve. The colonoscopy was performed without                         difficulty. The patient tolerated the procedure well.                         The quality of the bowel preparation was evaluated                         using the BBPS [facility] Bowel Preparation Scale) with                         scores of: Right Colon = 3, Transverse Colon = 3 and                         Left Colon = 3 (entire mucosa seen well with no                          residual staining, small fragments of stool or opaque                         liquid). The total BBPS score equals 9. The terminal                         ileum, ileocecal valve, appendiceal orifice, and                         rectum were photographed. Findings:      The perianal and digital rectal examinations were normal. Pertinent       negatives include normal sphincter tone.      The terminal ileum appeared normal. Estimated blood loss: none.      Non-bleeding internal hemorrhoids were found during retroflexion. The       hemorrhoids were Grade I (internal hemorrhoids that do not prolapse).      A 1 to 2 mm polyp was found in the cecum. The polyp was sessile. The       polyp was removed with a cold biopsy forceps. Resection and retrieval       were complete. Estimated blood loss was minimal.      The exam was otherwise without abnormality on direct and retroflexion       views. Impression:            - The examined portion of the ileum was normal.                        - Non-bleeding internal hemorrhoids.                        -  One 1 to 2 mm polyp in the cecum, removed with a                         cold biopsy forceps. Resected and retrieved.                        - The examination was otherwise normal on direct and                         retroflexion views. Recommendation:        - Discharge patient to home.                        - Resume previous diet.                        - Continue present medications.                        - Await pathology results.                        - Repeat colonoscopy for surveillance based on                         pathology results.                        - Return to GI clinic as previously scheduled. Procedure Code(s):     --- Professional ---                        [phone], Colonoscopy, flexible; with biopsy, single or                         multiple Diagnosis Code(s):     --- Professional ---                        K64.0, First degree  hemorrhoids                        K63.5, Polyp of colon                        K59.04, Chronic idiopathic constipation CPT copyright 2019 American [employer]. All rights reserved. The codes documented in this report are preliminary and upon coder review may  be revised to meet current compliance requirements. Attending Participation:      I personally performed the entire procedure. Volney American, DO Annamaria Helling DO, DO 04/20/2021 8:32:15 AM This report has been signed electronically. Number of Addenda: 0 Note Initiated On: 04/20/2021 7:38 AM Scope Withdrawal Time: 0 hours 13 minutes 32 seconds  Total Procedure Duration: 0 hours 22 minutes 58 seconds  Estimated Blood Loss:  Estimated blood loss was minimal.      [facility]

## 2021-04-20 NOTE — Op Note (Signed)
[facility] Gastroenterology Patient Name: Kaitlyn Nelson Procedure Date: 04/20/2021 7:39 AM MRN: [id number] Account #: [id number] Date of Birth: 1941-02-04 Admit Type: Outpatient Age: 80 Room: [facility] ENDO ROOM 2 Gender: Female Note Status: Finalized Instrument Name: Upper Endoscope [id number] Procedure:             Upper GI endoscopy Indications:           Dysphagia Providers:             Annamaria Helling DO, DO Referring MD:          Tracie Harrier, MD (Referring MD) Medicines:             Monitored Anesthesia Care Complications:         No immediate complications. Estimated blood loss: None. Procedure:             Pre-Anesthesia Assessment:                        - Prior to the procedure, a History and Physical was                         performed, and patient medications and allergies were                         reviewed. The patient is competent. The risks and                         benefits of the procedure and the sedation options and                         risks were discussed with the patient. All questions                         were answered and informed consent was obtained.                         Patient identification and proposed procedure were                         verified by the physician, the nurse, the anesthetist                         and the technician in the endoscopy suite. Mental                         Status Examination: alert and oriented. Airway                         Examination: normal oropharyngeal airway and neck                         mobility. Respiratory Examination: clear to                         auscultation. CV Examination: RRR, no murmurs, no S3                         or S4. Prophylactic Antibiotics: The patient does not  require prophylactic antibiotics. Prior                         Anticoagulants: The patient has taken no previous                         anticoagulant or  antiplatelet agents. ASA Grade                         Assessment: III - A patient with severe systemic                         disease. After reviewing the risks and benefits, the                         patient was deemed in satisfactory condition to                         undergo the procedure. The anesthesia plan was to use                         monitored anesthesia care (MAC). Immediately prior to                         administration of medications, the patient was                         re-assessed for adequacy to receive sedatives. The                         heart rate, respiratory rate, oxygen saturations,                         blood pressure, adequacy of pulmonary ventilation, and                         response to care were monitored throughout the                         procedure. The physical status of the patient was                         re-assessed after the procedure.                        After obtaining informed consent, the endoscope was                         passed under direct vision. Throughout the procedure,                         the patient's blood pressure, pulse, and oxygen                         saturations were monitored continuously. The Endoscope                         was introduced through the mouth, and advanced to the  second part of duodenum. The upper GI endoscopy was                         accomplished without difficulty. The patient tolerated                         the procedure well. Findings:      The duodenal bulb, first portion of the duodenum and second portion of       the duodenum were normal. Estimated blood loss: none.      A few 1 to 2 mm sessile polyps with no bleeding and no stigmata of       recent bleeding were found on the greater curvature of the stomach.       Estimated blood loss: none.      The exam of the stomach was otherwise normal.      The Z-line was regular. Estimated blood loss:  none.      Esophagogastric landmarks were identified: the gastroesophageal junction       was found at 38 cm from the incisors.      Normal mucosa was found in the entire esophagus.      Abnormal motility was noted in the esophagus. The cricopharyngeus was       normal. 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 The distal       esophagus/lower esophageal sphincter is open. Tertiary peristaltic waves       are noted. The scope was withdrawn. Dilation was performed with a       Maloney dilator with no resistance at 52 Fr. Estimated blood loss: none.       Post dilation inspection with no mucosal disruption. Impression:            - Normal duodenal bulb, first portion of the duodenum                         and second portion of the duodenum.                        - A few gastric polyps.                        - Z-line regular.                        - Esophagogastric landmarks identified.                        - Normal mucosa was found in the entire esophagus.                        - Abnormal esophageal motility. Dilated.                        - No specimens collected. Recommendation:        - Discharge patient to home.                        - Resume previous diet.                        - Continue present medications.                        -  Return to GI clinic as previously scheduled. Procedure Code(s):     --- Professional ---                        [phone], Esophagogastroduodenoscopy, flexible,                         transoral; diagnostic, including collection of                         specimen(s) by brushing or washing, when performed                         (separate procedure)                        [id number], Dilation of esophagus, by unguided sound or                         bougie, single or multiple passes Diagnosis Code(s):     --- Professional ---                        K31.7, Polyp of stomach and duodenum                        K22.4, Dyskinesia of esophagus                         R13.10, Dysphagia, unspecified CPT copyright 2019 American [employer] Association. All rights reserved. The codes documented in this report are preliminary and upon coder review may  be revised to meet current compliance requirements. Attending Participation:      I personally performed the entire procedure. Volney American, DO Annamaria Helling DO, DO 04/20/2021 8:29:42 AM This report has been signed electronically. Number of Addenda: 0 Note Initiated On: 04/20/2021 7:39 AM Estimated Blood Loss:  Estimated blood loss: none.      [facility]

## 2021-04-20 NOTE — Transfer of Care (Signed)
Immediate Anesthesia Transfer of Care Note  Patient: Kaitlyn Nelson  Procedure(s) Performed: COLONOSCOPY WITH PROPOFOL ESOPHAGOGASTRODUODENOSCOPY (EGD) WITH PROPOFOL  Patient Location: PACU  Anesthesia Type:General  Level of Consciousness: awake, alert  and drowsy  Airway & Oxygen Therapy: Patient Spontanous Breathing  Post-op Assessment: Report given to RN and Post -op Vital signs reviewed and stable  Post vital signs: Reviewed and stable  Last Vitals:  Vitals Value Taken Time  BP 97/49 04/20/21 0832  Temp 36.1 C 04/20/21 0830  Pulse 71 04/20/21 0837  Resp 13 04/20/21 0837  SpO2 99 % 04/20/21 0837  Vitals shown include unvalidated device data.  Last Pain:  Vitals:   04/20/21 0830  TempSrc: Tympanic  PainSc: Asleep         Complications: No notable events documented.

## 2021-04-20 NOTE — Anesthesia Postprocedure Evaluation (Signed)
Anesthesia Post Note  Patient: Adriona Kaney Jeanty  Procedure(s) Performed: COLONOSCOPY WITH PROPOFOL ESOPHAGOGASTRODUODENOSCOPY (EGD) WITH PROPOFOL  Patient location during evaluation: Phase II Anesthesia Type: General Level of consciousness: awake and alert, awake and oriented Pain management: pain level controlled Vital Signs Assessment: post-procedure vital signs reviewed and stable Respiratory status: spontaneous breathing, nonlabored ventilation and respiratory function stable Cardiovascular status: blood pressure returned to baseline and stable Postop Assessment: no apparent nausea or vomiting Anesthetic complications: no   No notable events documented.   Last Vitals:  Vitals:   04/20/21 0850 04/20/21 0900  BP: 137/64 (!) 143/61  Pulse: 70 70  Resp: 15 17  Temp:    SpO2: 99% 97%    Last Pain:  Vitals:   04/20/21 0900  TempSrc:   PainSc: 0-No pain                 Phill Mutter

## 2021-04-20 NOTE — Interval H&P Note (Signed)
History and Physical Interval Note: Preprocedure H&P from 04/20/21  was reviewed and there was no interval change after seeing and examining the patient.  Written consent was obtained from the patient after discussion of risks, benefits, and alternatives. 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  The various methods of treatment have been discussed with the patient and family. After consideration of risks, benefits and other options for treatment, the patient has consented to  Procedure(s): COLONOSCOPY WITH PROPOFOL (N/A) ESOPHAGOGASTRODUODENOSCOPY (EGD) WITH PROPOFOL (N/A) as a surgical intervention.  The patient's history has been reviewed, patient examined, no change in status, stable for surgery.  I have reviewed the patient's chart and labs.  Questions were answered to the patient's satisfaction.     Annamaria Helling

## 2021-04-21 ENCOUNTER — Encounter: Payer: Self-pay | Admitting: Gastroenterology

## 2021-04-21 LAB — SURGICAL PATHOLOGY

## 2021-04-28 ENCOUNTER — Encounter: Payer: Self-pay | Admitting: Gastroenterology

## 2021-11-12 ENCOUNTER — Other Ambulatory Visit: Payer: Self-pay | Admitting: Internal Medicine

## 2021-11-12 DIAGNOSIS — Z1231 Encounter for screening mammogram for malignant neoplasm of breast: Secondary | ICD-10-CM

## 2021-12-10 ENCOUNTER — Ambulatory Visit
Admission: RE | Admit: 2021-12-10 | Discharge: 2021-12-10 | Disposition: A | Discharge status: Home / Self Care | Payer: 59 | Source: Ambulatory Visit | Attending: Internal Medicine | Admitting: Internal Medicine

## 2021-12-10 DIAGNOSIS — Z1231 Encounter for screening mammogram for malignant neoplasm of breast: Secondary | ICD-10-CM | POA: Diagnosis present

## 2021-12-25 ENCOUNTER — Other Ambulatory Visit
Admission: RE | Admit: 2021-12-25 | Discharge: 2021-12-25 | Disposition: A | Discharge status: Home / Self Care | Payer: 59 | Source: Ambulatory Visit | Attending: Family Medicine | Admitting: Family Medicine

## 2021-12-25 DIAGNOSIS — R0609 Other forms of dyspnea: Secondary | ICD-10-CM | POA: Insufficient documentation

## 2021-12-25 DIAGNOSIS — R600 Localized edema: Secondary | ICD-10-CM | POA: Diagnosis present

## 2021-12-25 DIAGNOSIS — R0601 Orthopnea: Secondary | ICD-10-CM | POA: Insufficient documentation

## 2021-12-25 LAB — BRAIN NATRIURETIC PEPTIDE: B Natriuretic Peptide: 320.4 pg/mL — ABNORMAL HIGH (ref 0.0–100.0)

## 2022-01-06 IMAGING — MG MM DIGITAL SCREENING BILAT W/ TOMO AND CAD
8 of 14 series · 8 of 40 positions shown · non-contrast
Comparison: Previous exam(s).

CLINICAL DATA: Screening.

EXAM:
DIGITAL SCREENING BILATERAL MAMMOGRAM WITH TOMOSYNTHESIS AND CAD
TECHNIQUE: Bilateral screening digital craniocaudal and mediolateral oblique
mammograms were obtained. Bilateral screening digital breast
tomosynthesis was performed. The images were evaluated with
computer-aided detection.

[R CC synth-2D (1 of 2)]
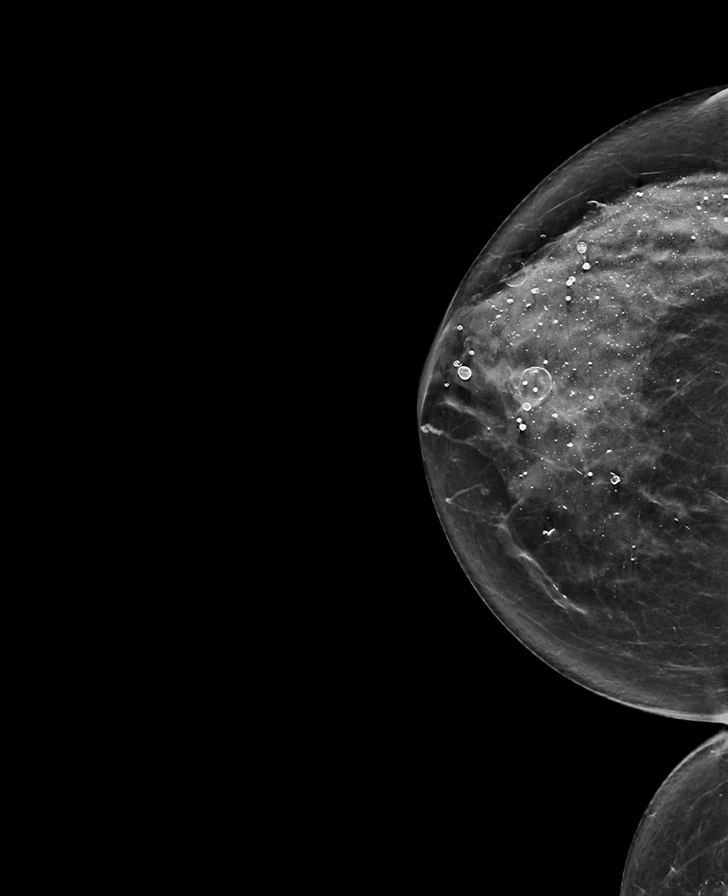

[L MLO synth-2D]
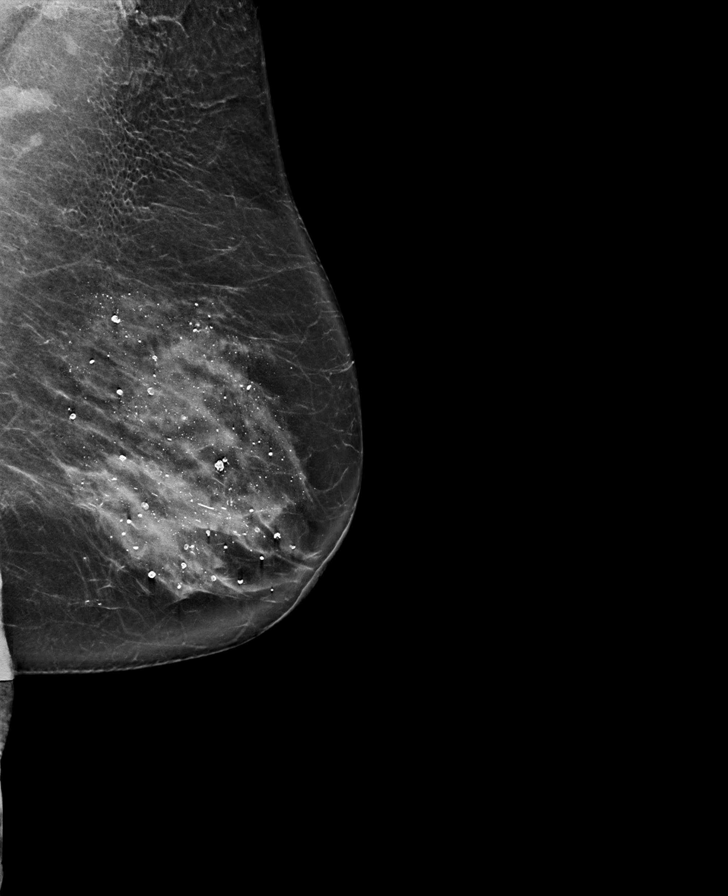

[L CC synth-2D (1 of 2)]
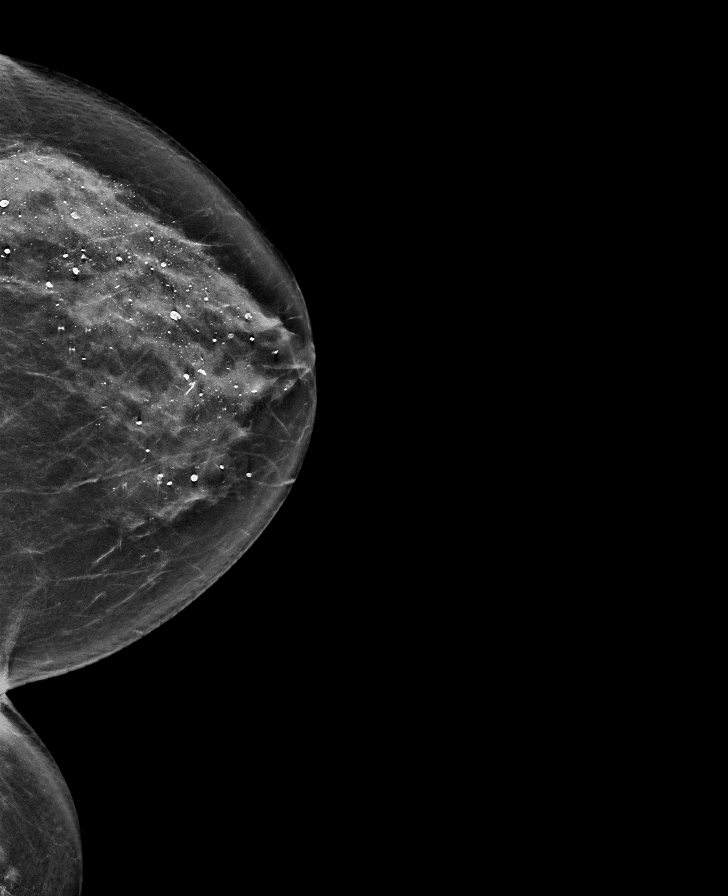

[R CC synth-2D (2 of 2)]
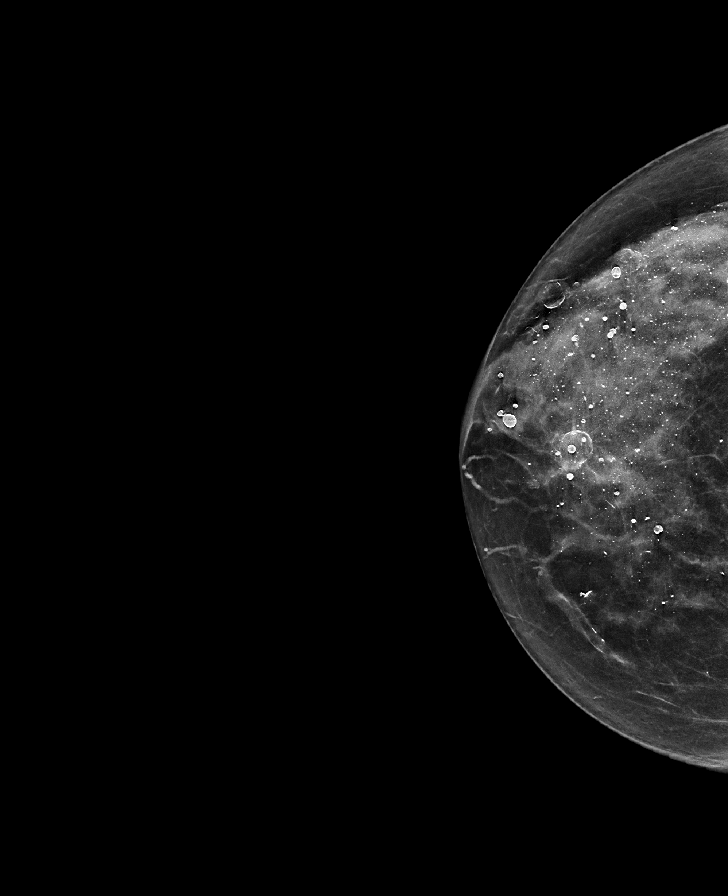

[R MLO synth-2D (1 of 2)]
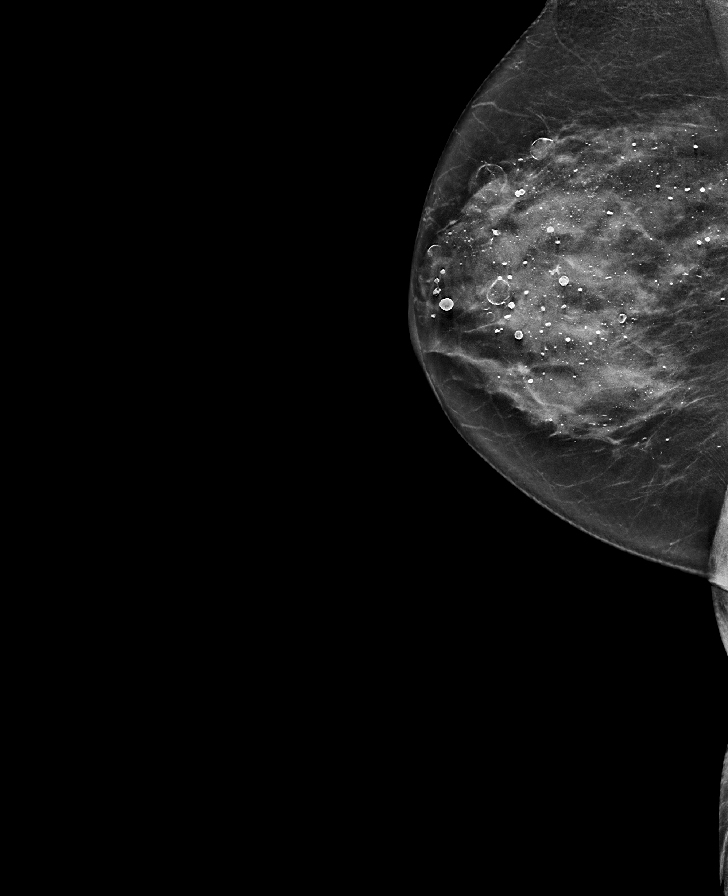

[R MLO synth-2D (2 of 2)]
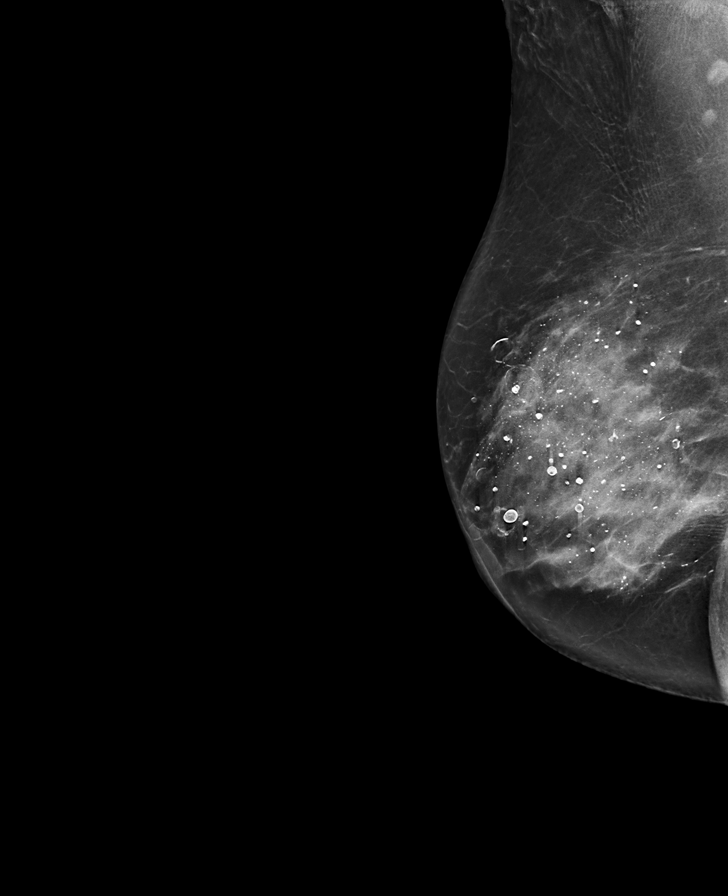

[L CC synth-2D (2 of 2)]
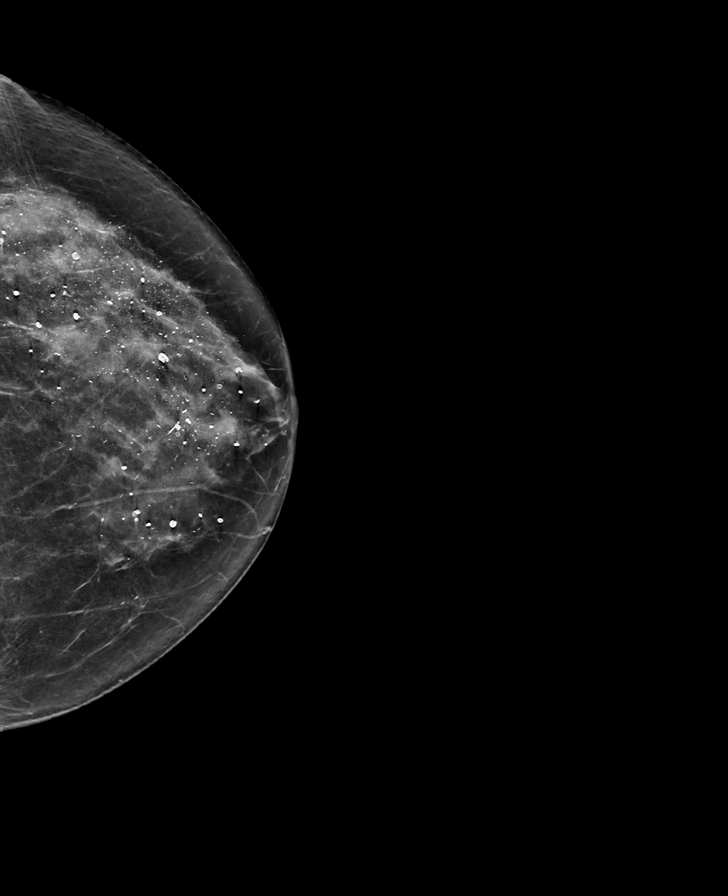

[R MLO tomo · tomo slice 36/71.0]
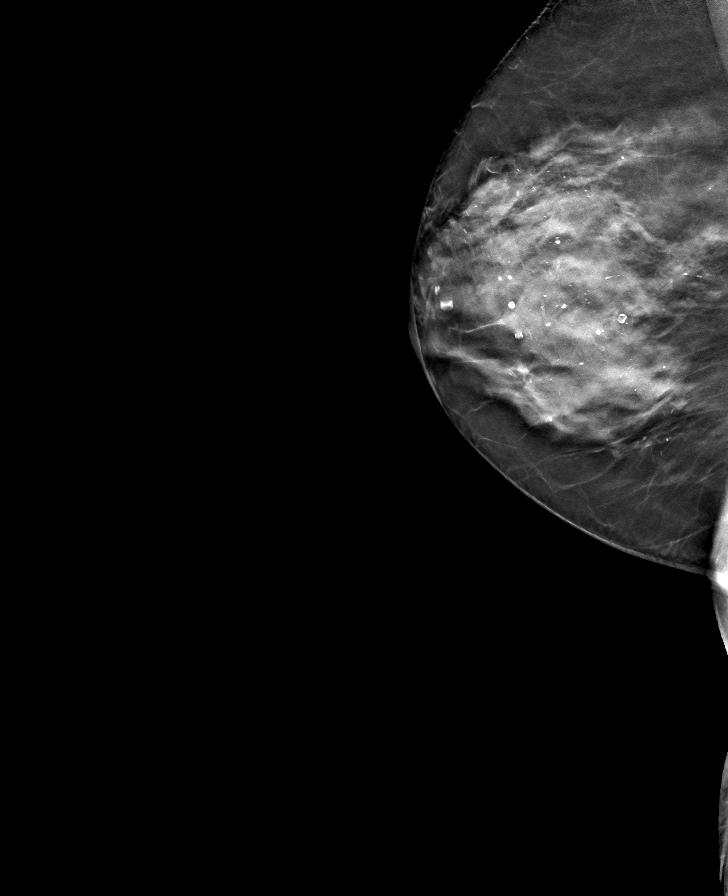

[8 of 40 positions shown; findings below may reference images not displayed]

ACR Breast Density Category d: The breast tissue is extremely dense,
which lowers the sensitivity of mammography
FINDINGS: There are no findings suspicious for malignancy.
IMPRESSION: No mammographic evidence of malignancy. A result letter of this
screening mammogram will be mailed directly to the patient.

RECOMMENDATION:
Screening mammogram in one year. (Code:TA-[id number])

BI-RADS CATEGORY  1: Negative.

## 2022-01-21 ENCOUNTER — Other Ambulatory Visit (INDEPENDENT_AMBULATORY_CARE_PROVIDER_SITE_OTHER): Payer: Self-pay | Admitting: Vascular Surgery

## 2022-01-21 DIAGNOSIS — I6523 Occlusion and stenosis of bilateral carotid arteries: Secondary | ICD-10-CM

## 2022-01-25 ENCOUNTER — Ambulatory Visit (INDEPENDENT_AMBULATORY_CARE_PROVIDER_SITE_OTHER): Payer: 59 | Admitting: Vascular Surgery

## 2022-01-25 ENCOUNTER — Ambulatory Visit (INDEPENDENT_AMBULATORY_CARE_PROVIDER_SITE_OTHER): Payer: 59

## 2022-01-25 ENCOUNTER — Encounter (INDEPENDENT_AMBULATORY_CARE_PROVIDER_SITE_OTHER): Payer: Self-pay | Admitting: Vascular Surgery

## 2022-01-25 VITALS — BP 185/91 | HR 64 | Resp 16 | Ht <= 58 in | Wt 149.0 lb

## 2022-01-25 DIAGNOSIS — I10 Essential (primary) hypertension: Secondary | ICD-10-CM

## 2022-01-25 DIAGNOSIS — K2100 Gastro-esophageal reflux disease with esophagitis, without bleeding: Secondary | ICD-10-CM

## 2022-01-25 DIAGNOSIS — I890 Lymphedema, not elsewhere classified: Secondary | ICD-10-CM | POA: Diagnosis not present

## 2022-01-25 DIAGNOSIS — I6523 Occlusion and stenosis of bilateral carotid arteries: Secondary | ICD-10-CM

## 2022-01-25 DIAGNOSIS — E782 Mixed hyperlipidemia: Secondary | ICD-10-CM

## 2022-01-25 NOTE — Progress Notes (Signed)
MRN : [id number]  Kaitlyn Nelson is a 81 y.o. (08-31-1940) female who presents with chief complaint of check carotid arteries.  History of Present Illness:   The patient returns to the office for followup evaluation regarding leg swelling as well as carotid disease.  The swelling has persisted but with the lymph pump is much, much better controlled. The pain associated with swelling is essentially eliminated. There have not been any interval development of a ulcerations or wounds.   The patient denies problems with the pump, noting it is working well and the leggings are in good condition.   Since the previous visit the patient has been wearing graduated compression stockings and using the lymph pump on a routine basis and  has noted improvement in the lymphedema.    Patient stated the lymph pump has been a very positive factor in her care.    The patient is also followed for carotid stenosis. The carotid stenosis followed by ultrasound.    The patient denies amaurosis fugax. There is no recent history of TIA symptoms or focal motor deficits. There is no prior documented CVA.   The patient is taking enteric-coated aspirin 81 mg daily.   The patient has a history of coronary artery disease, no recent episodes of angina or shortness of breath. The patient denies PAD or claudication symptoms. There is a history of hyperlipidemia which is being treated with a statin.   Duplex ultrasound of the carotid arteries today shows RICA 5-88% stenosis and LICA 5-02%   No outpatient medications have been marked as taking for the 01/25/22 encounter (Appointment) with Delana Meyer, Dolores Lory, MD.    Past Medical History:  Diagnosis Date   Arthritis    Benign fundic gland polyps of stomach    BPPV (benign paroxysmal positional vertigo)    Carotid artery stenosis    Environmental and seasonal allergies    Esophagitis    GERD (gastroesophageal reflux disease)    Heart murmur    HH  (hiatus hernia)    HOH (hard of hearing)    Hypertension    Lower extremity edema    Neuropathy    Osteoporosis    Seasonal allergies    Sleep apnea    CPAP    Past Surgical History:  Procedure Laterality Date   ABDOMINAL HYSTERECTOMY     BREAST CYST ASPIRATION Right    neg   CATARACT EXTRACTION W/PHACO Left 06/20/2018   Procedure: CATARACT EXTRACTION PHACO AND INTRAOCULAR LENS PLACEMENT ([place]) LEFT;  Surgeon: Leandrew Koyanagi, MD;  Location: [facility];  Service: Ophthalmology;  Laterality: Left;  [place] 00:47.6 AP% 12.3 CDE 3.19 Fluid Pack Lot # [id number]   CATARACT EXTRACTION W/PHACO Right 12/13/2018   Procedure: CATARACT EXTRACTION PHACO AND INTRAOCULAR LENS PLACEMENT (IOC)RIGHT;  Surgeon: Leandrew Koyanagi, MD;  Location: [place];  Service: Ophthalmology;  Laterality: Right;  sleep apnea   COLONOSCOPY WITH PROPOFOL N/A 06/18/2016   Procedure: COLONOSCOPY WITH PROPOFOL;  Surgeon: Lollie Sails, MD;  Location: [facility] ENDOSCOPY;  Service: Endoscopy;  Laterality: N/A;   COLONOSCOPY WITH PROPOFOL N/A 04/20/2021   Procedure: COLONOSCOPY WITH PROPOFOL;  Surgeon: Annamaria Helling, DO;  Location: [facility] ENDOSCOPY;  Service: Endoscopy;  Laterality: N/A;   EAR EXAMINATION UNDER ANESTHESIA     ESOPHAGOGASTRODUODENOSCOPY (EGD) WITH PROPOFOL N/A 06/18/2016   Procedure: ESOPHAGOGASTRODUODENOSCOPY (EGD) WITH PROPOFOL;  Surgeon: Lollie Sails, MD;  Location: [facility] ENDOSCOPY;  Service: Endoscopy;  Laterality: N/A;   ESOPHAGOGASTRODUODENOSCOPY (EGD) WITH PROPOFOL  N/A 04/20/2021   Procedure: ESOPHAGOGASTRODUODENOSCOPY (EGD) WITH PROPOFOL;  Surgeon: Annamaria Helling, DO;  Location: [place];  Service: Endoscopy;  Laterality: N/A;   EYE SURGERY     INNER EAR SURGERY     tympanoplasty with mastoidectomy with ossicular chain reconstruction Right     Social History Social History   Tobacco Use   Smoking status: Never   Smokeless tobacco: Never  Vaping Use    Vaping Use: Never used  Substance Use Topics   Alcohol use: Never   Drug use: Never    Family History Family History  Problem Relation Age of Onset   Breast cancer Maternal Aunt     Allergies  Allergen Reactions   Aspirin     Stomach pain   Augmentin [Amoxicillin-Pot Clavulanate] Diarrhea   Compazine [Prochlorperazine Edisylate]     Pt states she does not remember    Ibuprofen     Hurts pt's stomach    Triamterene    Sulfa Antibiotics Rash     REVIEW OF SYSTEMS (Negative unless checked)  Constitutional: '[]'$ Weight loss  '[]'$ Fever  '[]'$ Chills Cardiac: '[]'$ Chest pain   '[]'$ Chest pressure   '[]'$ Palpitations   '[]'$ Shortness of breath when laying flat   '[]'$ Shortness of breath with exertion. Vascular:  '[x]'$ Pain in legs with walking   '[]'$ Pain in legs at rest  '[]'$ History of DVT   '[]'$ Phlebitis   '[]'$ Swelling in legs   '[]'$ Varicose veins   '[]'$ Non-healing ulcers Pulmonary:   '[]'$ Uses home oxygen   '[]'$ Productive cough   '[]'$ Hemoptysis   '[]'$ Wheeze  '[]'$ COPD   '[]'$ Asthma Neurologic:  '[]'$ Dizziness   '[]'$ Seizures   '[]'$ History of stroke   '[]'$ History of TIA  '[]'$ Aphasia   '[]'$ Vissual changes   '[]'$ Weakness or numbness in arm   '[]'$ Weakness or numbness in leg Musculoskeletal:   '[]'$ Joint swelling   '[x]'$ Joint pain   '[]'$ Low back pain Hematologic:  '[]'$ Easy bruising  '[]'$ Easy bleeding   '[]'$ Hypercoagulable state   '[]'$ Anemic Gastrointestinal:  '[]'$ Diarrhea   '[]'$ Vomiting  '[x]'$ Gastroesophageal reflux/heartburn   '[]'$ Difficulty swallowing. Genitourinary:  '[]'$ Chronic kidney disease   '[]'$ Difficult urination  '[]'$ Frequent urination   '[]'$ Blood in urine Skin:  '[]'$ Rashes   '[]'$ Ulcers  Psychological:  '[]'$ History of anxiety   '[]'$  History of major depression.  Physical Examination  There were no vitals filed for this visit. There is no height or weight on file to calculate BMI. Gen: WD/WN, NAD Head: [place]/AT, No temporalis wasting.  Ear/Nose/Throat: Hearing grossly intact, nares w/o erythema or drainage Eyes: PER, EOMI, sclera nonicteric.  Neck: Supple, no masses.  No bruit or  JVD.  Pulmonary:  Good air movement, no audible wheezing, no use of accessory muscles.  Cardiac: RRR, normal S1, S2, no Murmurs. Vascular:   no carotid bruit noted; 2-3+ edema of both lower extremities Vessel Right Left  Radial Palpable Palpable  Carotid  Palpable  Palpable  Subclav  Palpable Palpable  Gastrointestinal: soft, non-distended. No guarding/no peritoneal signs.  Musculoskeletal: M/S 5/5 throughout.  No visible deformity.  Neurologic: CN 2-12 intact. Pain and light touch intact in extremities.  Symmetrical.  Speech is fluent. Motor exam as listed above. Psychiatric: Judgment intact, Mood & affect appropriate for pt's clinical situation. Dermatologic: No rashes or ulcers noted.  No changes consistent with cellulitis.   CBC Lab Results  Component Value Date   WBC 15.0 (H) 07/02/2018   HGB 13.6 07/02/2018   HCT 42.8 07/02/2018   MCV 92.6 07/02/2018   PLT 226 07/02/2018    BMET    Component Value  Date/Time   NA 135 07/02/2018 2146   NA 130 (L) 03/02/2013 1441   K 4.1 07/02/2018 2146   K 3.8 03/02/2013 1441   CL 100 07/02/2018 2146   CL 98 03/02/2013 1441   CO2 26 07/02/2018 2146   CO2 26 03/02/2013 1441   GLUCOSE 139 (H) 07/02/2018 2146   GLUCOSE 107 (H) 03/02/2013 1441   BUN 23 07/02/2018 2146   BUN 15 03/02/2013 1441   CREATININE 0.61 07/02/2018 2146   CREATININE 0.70 03/02/2013 1441   CALCIUM 8.9 07/02/2018 2146   CALCIUM 8.3 (L) 03/02/2013 1441   GFRNONAA >60 07/02/2018 2146   GFRNONAA >60 03/02/2013 1441   GFRAA >60 07/02/2018 2146   GFRAA >60 03/02/2013 1441   CrCl cannot be calculated (Patient's most recent lab result is older than the maximum 21 days allowed.).  COAG No results found for: "INR", "PROTIME"  Radiology No results found.   Assessment/Plan 1. Bilateral carotid artery stenosis Recommend:  Given the patient's asymptomatic subcritical stenosis no further invasive testing or surgery at this time.  Duplex ultrasound shows 1-39%  stenosis bilaterally.  Continue antiplatelet therapy as prescribed Continue management of CAD, HTN and Hyperlipidemia Healthy heart diet,  encouraged exercise at least 4 times per week Follow up in 12 months with duplex ultrasound and physical exam   - VAS US CAROTID; Future  2. Lymphedema Recommend:  No surgery or intervention at this point in time.    I have reviewed my discussion with the patient regarding lymphedema and why it  causes symptoms.  Patient will continue wearing graduated compression on a daily basis. The patient should put the compression on first thing in the morning and removing them in the evening. The patient should not sleep in the compression.   In addition, behavioral modification throughout the day will be continued.  This will include frequent elevation (such as in a recliner), use of over the counter pain medications as needed and exercise such as walking.  The patient will continue aggressive use of the  lymph pump, given the left legging is not working I have asked her to call the company for a replacement.  This will continue to improve the edema control and prevent sequela such as ulcers and infections.   The patient will follow-up with me on an annual basis.    3. Benign essential hypertension Continue antihypertensive medications as already ordered, these medications have been reviewed and there are no changes at this time.   4. Gastroesophageal reflux disease with esophagitis without hemorrhage Continue PPI as already ordered, this medication has been reviewed and there are no changes at this time.  Avoidence of caffeine and alcohol  Moderate elevation of the head of the bed    5. Mixed hyperlipidemia Continue statin as ordered and reviewed, no changes at this time     Hortencia Pilar, MD  01/25/2022 1:16 PM

## 2022-02-02 ENCOUNTER — Encounter (INDEPENDENT_AMBULATORY_CARE_PROVIDER_SITE_OTHER): Payer: Self-pay | Admitting: Vascular Surgery

## 2022-09-09 ENCOUNTER — Other Ambulatory Visit: Payer: Self-pay | Admitting: Internal Medicine

## 2022-09-09 DIAGNOSIS — R109 Unspecified abdominal pain: Secondary | ICD-10-CM

## 2022-09-09 DIAGNOSIS — R1031 Right lower quadrant pain: Secondary | ICD-10-CM

## 2022-09-14 ENCOUNTER — Other Ambulatory Visit: Payer: Self-pay | Admitting: Internal Medicine

## 2022-09-14 DIAGNOSIS — R1031 Right lower quadrant pain: Secondary | ICD-10-CM

## 2022-09-14 DIAGNOSIS — R109 Unspecified abdominal pain: Secondary | ICD-10-CM

## 2022-09-21 ENCOUNTER — Ambulatory Visit
Admission: RE | Admit: 2022-09-21 | Discharge: 2022-09-21 | Disposition: A | Discharge status: Home / Self Care | Payer: 59 | Source: Ambulatory Visit | Attending: Internal Medicine | Admitting: Internal Medicine

## 2022-09-21 DIAGNOSIS — R1031 Right lower quadrant pain: Secondary | ICD-10-CM

## 2022-09-21 DIAGNOSIS — R109 Unspecified abdominal pain: Secondary | ICD-10-CM

## 2022-09-21 MED ORDER — IOPAMIDOL (ISOVUE-300) INJECTION 61%
100.0000 mL | Freq: Once | INTRAVENOUS | Status: AC | PRN
  Administered 2022-09-21: 100 mL via INTRAVENOUS

## 2022-11-10 ENCOUNTER — Other Ambulatory Visit: Payer: Self-pay | Admitting: Internal Medicine

## 2022-11-10 DIAGNOSIS — Z1231 Encounter for screening mammogram for malignant neoplasm of breast: Secondary | ICD-10-CM

## 2022-12-14 ENCOUNTER — Ambulatory Visit
Admission: RE | Admit: 2022-12-14 | Discharge: 2022-12-14 | Disposition: A | Discharge status: Home / Self Care | Payer: 59 | Source: Ambulatory Visit | Attending: Internal Medicine | Admitting: Internal Medicine

## 2022-12-14 DIAGNOSIS — Z1231 Encounter for screening mammogram for malignant neoplasm of breast: Secondary | ICD-10-CM | POA: Diagnosis present

## 2023-01-26 NOTE — Progress Notes (Deleted)
MRN : [id number]  Kaitlyn Nelson is a 82 y.o. (Jun 19, 1940) female who presents with chief complaint of check carotid arteries.  History of Present Illness:   The patient returns to the office for followup evaluation regarding leg swelling as well as carotid disease.  The swelling has persisted but with the lymph pump is much, much better controlled. The pain associated with swelling is essentially eliminated. There have not been any interval development of a ulcerations or wounds.   The patient denies problems with the pump, noting it is working well and the leggings are in good condition.   Since the previous visit the patient has been wearing graduated compression stockings and using the lymph pump on a routine basis and  has noted improvement in the lymphedema.    Patient stated the lymph pump has been a very positive factor in her care.    The patient is also followed for carotid stenosis. The carotid stenosis followed by ultrasound.    The patient denies amaurosis fugax. There is no recent history of TIA symptoms or focal motor deficits. There is no prior documented CVA.   The patient is taking enteric-coated aspirin 81 mg daily.   The patient has a history of coronary artery disease, no recent episodes of angina or shortness of breath. The patient denies PAD or claudication symptoms. There is a history of hyperlipidemia which is being treated with a statin.    Duplex ultrasound of the carotid arteries today shows RICA 1-39% stenosis and LICA 1-39%   No outpatient medications have been marked as taking for the 01/27/23 encounter (Appointment) with Gilda Crease, Latina Craver, MD.    Past Medical History:  Diagnosis Date   Arthritis    Benign fundic gland polyps of stomach    BPPV (benign paroxysmal positional vertigo)    Carotid artery stenosis    Environmental and seasonal allergies    Esophagitis    GERD (gastroesophageal reflux disease)    Heart  murmur    HH (hiatus hernia)    HOH (hard of hearing)    Hypertension    Lower extremity edema    Neuropathy    Osteoporosis    Seasonal allergies    Sleep apnea    CPAP    Past Surgical History:  Procedure Laterality Date   ABDOMINAL HYSTERECTOMY     BREAST CYST ASPIRATION Right    neg   CATARACT EXTRACTION W/PHACO Left 06/20/2018   Procedure: CATARACT EXTRACTION PHACO AND INTRAOCULAR LENS PLACEMENT (I[facility]) LEFT;  Surgeon: Lockie Mola, MD;  Location: [facility] ORS;  Service: Ophthalmology;  Laterality: Left;  [place] 00:47.6 AP% 12.3 CDE 3.19 Fluid Pack Lot # [id number] H   CATARACT EXTRACTION W/PHACO Right 12/13/2018   Procedure: CATARACT EXTRACTION PHACO AND INTRAOCULAR LENS PLACEMENT (I[facility])RIGHT;  Surgeon: Lockie Mola, MD;  Location: [facility];  Service: Ophthalmology;  Laterality: Right;  sleep apnea   COLONOSCOPY WITH PROPOFOL N/A 06/18/2016   Procedure: COLONOSCOPY WITH PROPOFOL;  Surgeon: Christena Deem, MD;  Location: [facility] ENDOSCOPY;  Service: Endoscopy;  Laterality: N/A;   COLONOSCOPY WITH PROPOFOL N/A 04/20/2021   Procedure: COLONOSCOPY WITH PROPOFOL;  Surgeon: Jaynie Collins, DO;  Location: [facility] ENDOSCOPY;  Service: Endoscopy;  Laterality: N/A;   EAR EXAMINATION UNDER ANESTHESIA     ESOPHAGOGASTRODUODENOSCOPY (EGD) WITH PROPOFOL N/A 06/18/2016   Procedure: ESOPHAGOGASTRODUODENOSCOPY (EGD) WITH PROPOFOL;  Surgeon: Christena Deem, MD;  Location: [facility] ENDOSCOPY;  Service: Endoscopy;  Laterality: N/A;   ESOPHAGOGASTRODUODENOSCOPY (EGD) WITH PROPOFOL N/A 04/20/2021   Procedure: ESOPHAGOGASTRODUODENOSCOPY (EGD) WITH PROPOFOL;  Surgeon: Jaynie Collins, DO;  Location: [facility] ENDOSCOPY;  Service: Endoscopy;  Laterality: N/A;   EYE SURGERY     INNER EAR SURGERY     tympanoplasty with mastoidectomy with ossicular chain reconstruction Right     Social History Social History   Tobacco Use   Smoking status: Never   Smokeless tobacco: Never   Vaping Use   Vaping status: Never Used  Substance Use Topics   Alcohol use: Never   Drug use: Never    Family History Family History  Problem Relation Age of Onset   Breast cancer Maternal Aunt     Allergies  Allergen Reactions   Aspirin     Stomach pain   Augmentin [Amoxicillin-Pot Clavulanate] Diarrhea   Compazine [Prochlorperazine Edisylate]     Pt states she does not remember    Doxycycline Nausea Only    GI UPSET   Ibuprofen     Hurts pt's stomach    Triamterene    Sulfa Antibiotics Rash     REVIEW OF SYSTEMS (Negative unless checked)  Constitutional: [] Weight loss  [] Fever  [] Chills Cardiac: [] Chest pain   [] Chest pressure   [] Palpitations   [] Shortness of breath when laying flat   [] Shortness of breath with exertion. Vascular:  [x] Pain in legs with walking   [] Pain in legs at rest  [] History of DVT   [] Phlebitis   [] Swelling in legs   [] Varicose veins   [] Non-healing ulcers Pulmonary:   [] Uses home oxygen   [] Productive cough   [] Hemoptysis   [] Wheeze  [] COPD   [] Asthma Neurologic:  [] Dizziness   [] Seizures   [] History of stroke   [] History of TIA  [] Aphasia   [] Vissual changes   [] Weakness or numbness in arm   [] Weakness or numbness in leg Musculoskeletal:   [] Joint swelling   [] Joint pain   [] Low back pain Hematologic:  [] Easy bruising  [] Easy bleeding   [] Hypercoagulable state   [] Anemic Gastrointestinal:  [] Diarrhea   [] Vomiting  [x] Gastroesophageal reflux/heartburn   [] Difficulty swallowing. Genitourinary:  [] Chronic kidney disease   [] Difficult urination  [] Frequent urination   [] Blood in urine Skin:  [] Rashes   [] Ulcers  Psychological:  [] History of anxiety   []  History of major depression.  Physical Examination  There were no vitals filed for this visit. There is no height or weight on file to calculate BMI. Gen: WD/WN, NAD Head: [place]/AT, No temporalis wasting.  Ear/Nose/Throat: Hearing grossly intact, nares w/o erythema or drainage Eyes: PER, EOMI,  sclera nonicteric.  Neck: Supple, no masses.  No bruit or JVD.  Pulmonary:  Good air movement, no audible wheezing, no use of accessory muscles.  Cardiac: RRR, normal S1, S2, no Murmurs. Vascular:  carotid bruit noted Vessel Right Left  Radial Palpable Palpable  Carotid  Palpable  Palpable  Subclav  Palpable Palpable  Gastrointestinal: soft, non-distended. No guarding/no peritoneal signs.  Musculoskeletal: M/S 5/5 throughout.  No visible deformity.  Neurologic: CN 2-12 intact. Pain and light touch intact in extremities.  Symmetrical.  Speech is fluent. Motor exam as listed above. Psychiatric: Judgment intact, Mood & affect appropriate for pt's clinical situation. Dermatologic: No rashes or ulcers noted.  No changes consistent with cellulitis.   CBC Lab Results  Component Value Date   WBC 15.0 (H) 07/02/2018   HGB 13.6 07/02/2018  HCT 42.8 07/02/2018   MCV 92.6 07/02/2018   PLT 226 07/02/2018    BMET    Component Value Date/Time   NA 135 07/02/2018 2146   NA 130 (L) 03/02/2013 1441   K 4.1 07/02/2018 2146   K 3.8 03/02/2013 1441   CL 100 07/02/2018 2146   CL 98 03/02/2013 1441   CO2 26 07/02/2018 2146   CO2 26 03/02/2013 1441   GLUCOSE 139 (H) 07/02/2018 2146   GLUCOSE 107 (H) 03/02/2013 1441   BUN 23 07/02/2018 2146   BUN 15 03/02/2013 1441   CREATININE 0.61 07/02/2018 2146   CREATININE 0.70 03/02/2013 1441   CALCIUM 8.9 07/02/2018 2146   CALCIUM 8.3 (L) 03/02/2013 1441   GFRNONAA >60 07/02/2018 2146   GFRNONAA >60 03/02/2013 1441   GFRAA >60 07/02/2018 2146   GFRAA >60 03/02/2013 1441   CrCl cannot be calculated (Patient's most recent lab result is older than the maximum 21 days allowed.).  COAG No results found for: "INR", "PROTIME"  Radiology No results found.   Assessment/Plan There are no diagnoses linked to this encounter.   Levora Dredge, MD  01/26/2023 3:46 PM

## 2023-01-27 ENCOUNTER — Encounter (INDEPENDENT_AMBULATORY_CARE_PROVIDER_SITE_OTHER): Payer: Self-pay

## 2023-01-27 ENCOUNTER — Ambulatory Visit (INDEPENDENT_AMBULATORY_CARE_PROVIDER_SITE_OTHER): Payer: Medicare Other | Admitting: Vascular Surgery

## 2023-01-27 ENCOUNTER — Encounter (INDEPENDENT_AMBULATORY_CARE_PROVIDER_SITE_OTHER): Payer: Medicare Other

## 2023-01-27 DIAGNOSIS — E782 Mixed hyperlipidemia: Secondary | ICD-10-CM

## 2023-01-27 DIAGNOSIS — I10 Essential (primary) hypertension: Secondary | ICD-10-CM

## 2023-01-27 DIAGNOSIS — I6523 Occlusion and stenosis of bilateral carotid arteries: Secondary | ICD-10-CM

## 2023-01-27 DIAGNOSIS — K2100 Gastro-esophageal reflux disease with esophagitis, without bleeding: Secondary | ICD-10-CM

## 2023-01-27 DIAGNOSIS — I890 Lymphedema, not elsewhere classified: Secondary | ICD-10-CM

## 2023-03-08 ENCOUNTER — Other Ambulatory Visit: Payer: Self-pay | Admitting: Student

## 2023-03-08 DIAGNOSIS — R296 Repeated falls: Secondary | ICD-10-CM

## 2023-03-08 DIAGNOSIS — I10 Essential (primary) hypertension: Secondary | ICD-10-CM

## 2023-04-01 ENCOUNTER — Other Ambulatory Visit: Payer: Self-pay | Admitting: Student

## 2023-04-01 ENCOUNTER — Ambulatory Visit
Admission: RE | Admit: 2023-04-01 | Discharge: 2023-04-01 | Disposition: A | Discharge status: Home / Self Care | Payer: 59 | Source: Ambulatory Visit | Attending: Student | Admitting: Student

## 2023-04-01 DIAGNOSIS — I10 Essential (primary) hypertension: Secondary | ICD-10-CM | POA: Diagnosis present

## 2023-04-01 DIAGNOSIS — R296 Repeated falls: Secondary | ICD-10-CM | POA: Diagnosis present

## 2023-04-10 ENCOUNTER — Emergency Department
Admission: EM | Admit: 2023-04-10 | Discharge: 2023-04-10 | Disposition: A | Discharge status: Home / Self Care | Payer: 59 | Attending: Emergency Medicine | Admitting: Emergency Medicine

## 2023-04-10 ENCOUNTER — Other Ambulatory Visit: Payer: Self-pay

## 2023-04-10 DIAGNOSIS — M542 Cervicalgia: Secondary | ICD-10-CM | POA: Diagnosis present

## 2023-04-10 DIAGNOSIS — R519 Headache, unspecified: Secondary | ICD-10-CM | POA: Insufficient documentation

## 2023-04-10 DIAGNOSIS — M549 Dorsalgia, unspecified: Secondary | ICD-10-CM | POA: Insufficient documentation

## 2023-04-10 LAB — CBC WITH DIFFERENTIAL/PLATELET
Abs Immature Granulocytes: 0.02 10*3/uL (ref 0.00–0.07)
Basophils Absolute: 0 10*3/uL (ref 0.0–0.1)
Basophils Relative: 0 %
Eosinophils Absolute: 0 10*3/uL (ref 0.0–0.5)
Eosinophils Relative: 0 %
HCT: 42.2 % (ref 36.0–46.0)
Hemoglobin: 14.2 g/dL (ref 12.0–15.0)
Immature Granulocytes: 0 %
Lymphocytes Relative: 17 %
Lymphs Abs: 1.4 10*3/uL (ref 0.7–4.0)
MCH: 30 pg (ref 26.0–34.0)
MCHC: 33.6 g/dL (ref 30.0–36.0)
MCV: 89 fL (ref 80.0–100.0)
Monocytes Absolute: 0.9 10*3/uL (ref 0.1–1.0)
Monocytes Relative: 10 %
Neutro Abs: 6 10*3/uL (ref 1.7–7.7)
Neutrophils Relative %: 73 %
Platelets: 265 10*3/uL (ref 150–400)
RBC: 4.74 MIL/uL (ref 3.87–5.11)
RDW: 12.6 % (ref 11.5–15.5)
WBC: 8.3 10*3/uL (ref 4.0–10.5)
nRBC: 0 % (ref 0.0–0.2)

## 2023-04-10 LAB — COMPREHENSIVE METABOLIC PANEL
ALT: 30 U/L (ref 0–44)
AST: 39 U/L (ref 15–41)
Albumin: 4.8 g/dL (ref 3.5–5.0)
Alkaline Phosphatase: 81 U/L (ref 38–126)
Anion gap: 11 (ref 5–15)
BUN: 19 mg/dL (ref 8–23)
CO2: 25 mmol/L (ref 22–32)
Calcium: 9.4 mg/dL (ref 8.9–10.3)
Chloride: 91 mmol/L — ABNORMAL LOW (ref 98–111)
Creatinine, Ser: 0.72 mg/dL (ref 0.44–1.00)
GFR, Estimated: >60 mL/min (ref >60)
Glucose, Bld: 122 mg/dL — ABNORMAL HIGH (ref 70–99)
Potassium: 3.9 mmol/L (ref 3.5–5.1)
Sodium: 127 mmol/L — ABNORMAL LOW (ref 135–145)
Total Bilirubin: 0.5 mg/dL (ref <1.2)
Total Protein: 8.4 g/dL — ABNORMAL HIGH (ref 6.5–8.1)

## 2023-04-10 LAB — TROPONIN I (HIGH SENSITIVITY)
Troponin I (High Sensitivity): 5 ng/L (ref <18)
Troponin I (High Sensitivity): 7 ng/L (ref <18)

## 2023-04-10 MED ORDER — CYCLOBENZAPRINE HCL 10 MG PO TABS
5.0000 mg | ORAL_TABLET | Freq: Once | ORAL | Status: AC
  Administered 2023-04-10: 5 mg via ORAL
  Filled 2023-04-10: qty 1

## 2023-04-10 MED ORDER — CYCLOBENZAPRINE HCL 5 MG PO TABS: 5.0000 mg | ORAL_TABLET | Freq: Three times a day (TID) | ORAL | 0 refills | Status: AC | PRN

## 2023-04-10 NOTE — ED Triage Notes (Addendum)
Pt is coming from home d/t intermittent neck pain and left shoulder pain since yesterday.. Pt has hx of Scoliosis and HTN which bp was 192/90 on the truck. Pt sts this afternoon she started having an headache. Per EMS they noticed she has a heart monitor placed on her but pt does not recall what for.

## 2023-04-10 NOTE — ED Provider Notes (Signed)
[facility] Provider Note   Event Date/Time   First MD Initiated Contact with Patient 04/10/23 2055     (approximate) History  Neck Pain  HPI Kaitlyn Nelson is a 82 y.o. female with a stated past medical history of scoliosis with chronic neck and back pain who presents complaining of left-sided neck pain and headache. ROS: Patient currently denies any fevers, vision changes, tinnitus, difficulty speaking, facial droop, sore throat, chest pain, shortness of breath, abdominal pain, nausea/vomiting/diarrhea, dysuria, or weakness/numbness/paresthesias in any extremity   Physical Exam  Triage Vital Signs: ED Triage Vitals [04/10/23 2051]  Encounter Vitals Group     BP (!) 174/112     Systolic BP Percentile      Diastolic BP Percentile      Pulse Rate 78     Resp 18     Temp      Temp src      SpO2 97 %     Weight      Height      Head Circumference      Peak Flow      Pain Score      Pain Loc      Pain Education      Exclude from Growth Chart    Most recent vital signs: Vitals:   04/10/23 2058 04/10/23 2245  BP:  (!) 124/58  Pulse:    Resp:  20  Temp: 98.8 F (37.1 C)   SpO2:  97%   General: Awake, oriented x4. CV:  Good peripheral perfusion.  Resp:  Normal effort.  Abd:  No distention.  Other:  Elderly obese Caucasian female resting comfortably in no acute distress ED Results / Procedures / Treatments  Labs (all labs ordered are listed, but only abnormal results are displayed) Labs Reviewed  COMPREHENSIVE METABOLIC PANEL - Abnormal; Notable for the following components:      Result Value   Sodium 127 (*)    Chloride 91 (*)    Glucose, Bld 122 (*)    Total Protein 8.4 (*)    All other components within normal limits  CBC WITH DIFFERENTIAL/PLATELET  TROPONIN I (HIGH SENSITIVITY)  TROPONIN I (HIGH SENSITIVITY)   EKG ED ECG REPORT I, Merwyn Katos, the attending physician, personally viewed and interpreted this ECG. Date:  04/10/2023 EKG Time: 2103 Rate: 85 Rhythm: normal sinus rhythm QRS Axis: normal Intervals: normal ST/T Wave abnormalities: normal Narrative Interpretation: no evidence of acute ischemia PROCEDURES: Critical Care performed: No .1-3 Lead EKG Interpretation  Performed by: Merwyn Katos, MD Authorized by: Merwyn Katos, MD     Interpretation: normal     ECG rate:  71   ECG rate assessment: normal     Rhythm: sinus rhythm     Ectopy: none     Conduction: normal    MEDICATIONS ORDERED IN ED: Medications  cyclobenzaprine (FLEXERIL) tablet 5 mg (5 mg Oral Given 04/10/23 2110)   IMPRESSION / MDM / ASSESSMENT AND PLAN / ED COURSE  I reviewed the triage vital signs and the nursing notes.                             The patient is on the cardiac monitor to evaluate for evidence of arrhythmia and/or significant heart rate changes. Patient's presentation is most consistent with acute presentation with potential threat to life or bodily function. + neck pain  ED Workup: Defer C-Spine  imaging given negative by NEXUS criteria  Given History, Exam the patient appears to have a cervical radiculopathy.   Patient appears to be low risk for complications or other emergent conditions such as  anginal equivalent, frank cervical instability, arterial dissection, osteomyelitis, epidural abscess, central cord syndrome, c-spine fracture, other spinal emergencies  Rx: NSAIDs, outpatient physical therapy evaluation and recommendation for home exercises in the interim Disposition: Discharge. The patient has been given strict return precautions and understands the need to follow up within 48 hours with their primary care provider   FINAL CLINICAL IMPRESSION(S) / ED DIAGNOSES   Final diagnoses:  Neck pain on left side   Rx / DC Orders   ED Discharge Orders          Ordered    cyclobenzaprine (FLEXERIL) 5 MG tablet  3 times daily PRN        04/10/23 2301           Note:  This document  was prepared using Dragon voice recognition software and may include unintentional dictation errors.   Merwyn Katos, MD 04/10/23 [phone]

## 2023-04-11 NOTE — ED Notes (Signed)
Patient's sister Fannie Knee called asking for details of patient visit.  She said she is [facility] POA for patient.  I do not have that documented in the chart.  So, we spoke on speaker phone with the patient present with her sister.  [place] says it is okay to speak to Fannie Knee about her care.  I answered her questions.  SHe does have appt with Dr. Marcello Fennel tomorrow.  She will wait until then to start the exercises that were in her instructions.

## 2023-04-19 ENCOUNTER — Other Ambulatory Visit
Admission: RE | Admit: 2023-04-19 | Discharge: 2023-04-19 | Disposition: A | Discharge status: Home / Self Care | Payer: 59 | Source: Ambulatory Visit | Attending: Cardiology | Admitting: Cardiology

## 2023-04-19 DIAGNOSIS — I272 Pulmonary hypertension, unspecified: Secondary | ICD-10-CM | POA: Diagnosis present

## 2023-04-19 DIAGNOSIS — R0609 Other forms of dyspnea: Secondary | ICD-10-CM | POA: Diagnosis not present

## 2023-04-19 LAB — BRAIN NATRIURETIC PEPTIDE: B Natriuretic Peptide: 275.8 pg/mL — ABNORMAL HIGH (ref 0.0–100.0)

## 2023-07-07 ENCOUNTER — Other Ambulatory Visit: Payer: Self-pay

## 2023-07-07 ENCOUNTER — Emergency Department: Payer: 59

## 2023-07-07 ENCOUNTER — Emergency Department
Admission: EM | Admit: 2023-07-07 | Discharge: 2023-07-07 | Disposition: A | Discharge status: Home / Self Care | Payer: 59 | Attending: Emergency Medicine | Admitting: Emergency Medicine

## 2023-07-07 DIAGNOSIS — I10 Essential (primary) hypertension: Secondary | ICD-10-CM | POA: Diagnosis not present

## 2023-07-07 DIAGNOSIS — R112 Nausea with vomiting, unspecified: Secondary | ICD-10-CM | POA: Diagnosis present

## 2023-07-07 DIAGNOSIS — Z79899 Other long term (current) drug therapy: Secondary | ICD-10-CM | POA: Insufficient documentation

## 2023-07-07 DIAGNOSIS — Z1152 Encounter for screening for COVID-19: Secondary | ICD-10-CM | POA: Insufficient documentation

## 2023-07-07 DIAGNOSIS — K449 Diaphragmatic hernia without obstruction or gangrene: Secondary | ICD-10-CM | POA: Insufficient documentation

## 2023-07-07 DIAGNOSIS — M8588 Other specified disorders of bone density and structure, other site: Secondary | ICD-10-CM | POA: Diagnosis not present

## 2023-07-07 DIAGNOSIS — I119 Hypertensive heart disease without heart failure: Secondary | ICD-10-CM | POA: Diagnosis not present

## 2023-07-07 DIAGNOSIS — R109 Unspecified abdominal pain: Secondary | ICD-10-CM | POA: Diagnosis not present

## 2023-07-07 DIAGNOSIS — K2950 Unspecified chronic gastritis without bleeding: Secondary | ICD-10-CM | POA: Insufficient documentation

## 2023-07-07 DIAGNOSIS — R197 Diarrhea, unspecified: Secondary | ICD-10-CM | POA: Diagnosis not present

## 2023-07-07 DIAGNOSIS — I700 Atherosclerosis of aorta: Secondary | ICD-10-CM | POA: Insufficient documentation

## 2023-07-07 LAB — COMPREHENSIVE METABOLIC PANEL
ALT: 24 U/L (ref 0–44)
AST: 34 U/L (ref 15–41)
Albumin: 4.6 g/dL (ref 3.5–5.0)
Alkaline Phosphatase: 73 U/L (ref 38–126)
Anion gap: 17 — ABNORMAL HIGH (ref 5–15)
BUN: 22 mg/dL (ref 8–23)
CO2: 28 mmol/L (ref 22–32)
Calcium: 9.5 mg/dL (ref 8.9–10.3)
Chloride: 92 mmol/L — ABNORMAL LOW (ref 98–111)
Creatinine, Ser: 0.74 mg/dL (ref 0.44–1.00)
GFR, Estimated: >60 mL/min (ref >60)
Glucose, Bld: 182 mg/dL — ABNORMAL HIGH (ref 70–99)
Potassium: 4.9 mmol/L (ref 3.5–5.1)
Sodium: 137 mmol/L (ref 135–145)
Total Bilirubin: 1 mg/dL (ref 0.0–1.2)
Total Protein: 8 g/dL (ref 6.5–8.1)

## 2023-07-07 LAB — CBC
HCT: 43.2 % (ref 36.0–46.0)
Hemoglobin: 14.2 g/dL (ref 12.0–15.0)
MCH: 30 pg (ref 26.0–34.0)
MCHC: 32.9 g/dL (ref 30.0–36.0)
MCV: 91.1 fL (ref 80.0–100.0)
Platelets: 225 10*3/uL (ref 150–400)
RBC: 4.74 MIL/uL (ref 3.87–5.11)
RDW: 12.9 % (ref 11.5–15.5)
WBC: 15.4 10*3/uL — ABNORMAL HIGH (ref 4.0–10.5)
nRBC: 0 % (ref 0.0–0.2)

## 2023-07-07 LAB — RESP PANEL BY RT-PCR (RSV, FLU A&B, COVID)  RVPGX2
Influenza A by PCR: NEGATIVE
Influenza B by PCR: NEGATIVE
Resp Syncytial Virus by PCR: NEGATIVE
SARS Coronavirus 2 by RT PCR: NEGATIVE

## 2023-07-07 LAB — LIPASE, BLOOD: Lipase: 67 U/L — ABNORMAL HIGH (ref 11–51)

## 2023-07-07 LAB — URINALYSIS, ROUTINE W REFLEX MICROSCOPIC
Bacteria, UA: NONE SEEN
Bilirubin Urine: NEGATIVE
Glucose, UA: NEGATIVE mg/dL
Hgb urine dipstick: NEGATIVE
Ketones, ur: NEGATIVE mg/dL
Leukocytes,Ua: NEGATIVE
Nitrite: NEGATIVE
Protein, ur: 30 mg/dL — AB
Specific Gravity, Urine: 1.021 (ref 1.005–1.030)
Squamous Epithelial / HPF: 0 /[HPF] (ref 0–5)
pH: 5 (ref 5.0–8.0)

## 2023-07-07 MED ORDER — ONDANSETRON 4 MG PO TBDP
4.0000 mg | ORAL_TABLET | Freq: Once | ORAL | Status: AC | PRN
  Administered 2023-07-07: 4 mg via ORAL
  Filled 2023-07-07: qty 1

## 2023-07-07 MED ORDER — SODIUM CHLORIDE 0.9 % IV BOLUS (SEPSIS)
1000.0000 mL | Freq: Once | INTRAVENOUS | Status: AC
  Administered 2023-07-07: 1000 mL via INTRAVENOUS

## 2023-07-07 MED ORDER — IOHEXOL 300 MG/ML  SOLN
100.0000 mL | Freq: Once | INTRAMUSCULAR | Status: AC | PRN
  Administered 2023-07-07: 100 mL via INTRAVENOUS

## 2023-07-07 MED ORDER — ONDANSETRON 4 MG PO TBDP: 4.0000 mg | ORAL_TABLET | Freq: Four times a day (QID) | ORAL | 0 refills | Status: AC | PRN

## 2023-07-07 MED ORDER — ONDANSETRON HCL 4 MG/2ML IJ SOLN
4.0000 mg | Freq: Once | INTRAMUSCULAR | Status: AC
  Administered 2023-07-07: 4 mg via INTRAVENOUS
  Filled 2023-07-07: qty 2

## 2023-07-07 NOTE — ED Triage Notes (Signed)
 Pt to ED via [facility] from home c/o vomiting. Pt reports feeling sick for about a week, having body aches, bilateral ear pain, and headaches. Pt started vomiting last night. Denies and abd pain or diarrhea t this time. Denies CP, SHOB, dizziness, fevers

## 2023-07-07 NOTE — ED Provider Notes (Signed)
 [facility] Provider Note    Event Date/Time   First MD Initiated Contact with Patient 07/07/23 [phone]     (approximate)   History   Emesis   HPI  Kaitlyn Nelson is a 83 y.o. female with history of hypertension, GERD, hiatal hernia who presents to the emergency department complaints of nausea, vomiting and diarrhea that started tonight.  Is complaining of some right sided abdominal pain.  Has had previous hysterectomy and she thinks bilateral salpingo-oophorectomy but is not sure.  Denies any sick contacts.  No fever.  No dysuria, hematuria, vaginal bleeding or discharge.   History provided by patient.    Past Medical History:  Diagnosis Date   Arthritis    Benign fundic gland polyps of stomach    BPPV (benign paroxysmal positional vertigo)    Carotid artery stenosis    Environmental and seasonal allergies    Esophagitis    GERD (gastroesophageal reflux disease)    Heart murmur    HH (hiatus hernia)    HOH (hard of hearing)    Hypertension    Lower extremity edema    Neuropathy    Osteoporosis    Seasonal allergies    Sleep apnea    CPAP    Past Surgical History:  Procedure Laterality Date   ABDOMINAL HYSTERECTOMY     BREAST CYST ASPIRATION Right    neg   CATARACT EXTRACTION W/PHACO Left 06/20/2018   Procedure: CATARACT EXTRACTION PHACO AND INTRAOCULAR LENS PLACEMENT (IOC) LEFT;  Surgeon: Mittie Gaskin, MD;  Location: [facility] ORS;  Service: Ophthalmology;  Laterality: Left;  US  00:47.6 AP% 12.3 CDE 3.19 Fluid Pack Lot # [id number]   CATARACT EXTRACTION W/PHACO Right 12/13/2018   Procedure: CATARACT EXTRACTION PHACO AND INTRAOCULAR LENS PLACEMENT (IOC)RIGHT;  Surgeon: Mittie Gaskin, MD;  Location: [facility];  Service: Ophthalmology;  Laterality: Right;  sleep apnea   COLONOSCOPY WITH PROPOFOL  N/A 06/18/2016   Procedure: COLONOSCOPY WITH PROPOFOL ;  Surgeon: Gladis RAYMOND Mariner, MD;  Location: [facility] ENDOSCOPY;   Service: Endoscopy;  Laterality: N/A;   COLONOSCOPY WITH PROPOFOL  N/A 04/20/2021   Procedure: COLONOSCOPY WITH PROPOFOL ;  Surgeon: Onita Elspeth Sharper, DO;  Location: [facility] ENDOSCOPY;  Service: Endoscopy;  Laterality: N/A;   EAR EXAMINATION UNDER ANESTHESIA     ESOPHAGOGASTRODUODENOSCOPY (EGD) WITH PROPOFOL  N/A 06/18/2016   Procedure: ESOPHAGOGASTRODUODENOSCOPY (EGD) WITH PROPOFOL ;  Surgeon: Gladis RAYMOND Mariner, MD;  Location: [facility] ENDOSCOPY;  Service: Endoscopy;  Laterality: N/A;   ESOPHAGOGASTRODUODENOSCOPY (EGD) WITH PROPOFOL  N/A 04/20/2021   Procedure: ESOPHAGOGASTRODUODENOSCOPY (EGD) WITH PROPOFOL ;  Surgeon: Onita Elspeth Sharper, DO;  Location: [facility] ENDOSCOPY;  Service: Endoscopy;  Laterality: N/A;   EYE SURGERY     INNER EAR SURGERY     tympanoplasty with mastoidectomy with ossicular chain reconstruction Right     MEDICATIONS:  Prior to Admission medications   Medication Sig Start Date End Date Taking? Authorizing Provider  acetaminophen  (TYLENOL ) 500 MG chewable tablet Chew 500 mg by mouth every 6 (six) hours as needed for pain.    [provider]  calcium  carbonate (OS-CAL) 600 MG TABS tablet Take 600 mg by mouth 2 (two) times daily with a meal.    [provider]  clindamycin (CLEOCIN) 300 MG capsule Take 300 mg by mouth 3 (three) times daily. For 10 days Patient not taking: Reported on 01/25/2022    [provider]  Cyanocobalamin (B-12) 2000 MCG TABS Take 2,000 mcg by mouth daily.    [provider]  furosemide  (LASIX )  20 MG tablet Take by mouth. Patient not taking: Reported on 04/20/2021 01/02/21   [provider]  gabapentin  (NEURONTIN ) 100 MG capsule Take 100 mg by mouth 3 (three) times daily. One in am two in pm    [provider]  hydrochlorothiazide (MICROZIDE) 12.5 MG capsule Take 12.5 mg by mouth daily.    [provider]  loratadine  (CLARITIN ) 10 MG tablet Take 10 mg by mouth daily. Patient not taking: Reported  on 01/25/2022    [provider]  Loratadine -Pseudoephedrine (KS ALLERCLEAR D-24HR PO) Take by mouth daily. Patient not taking: Reported on 01/25/2022    [provider]  losartan  (COZAAR ) 100 MG tablet Take 100 mg by mouth daily.    [provider]  meclizine (ANTIVERT) 25 MG tablet Take 25 mg by mouth 3 (three) times daily as needed for dizziness.    [provider]  metoprolol  tartrate (LOPRESSOR ) 50 MG tablet Take 75 mg by mouth 2 (two) times daily.     [provider]  mometasone (NASONEX) 50 MCG/ACT nasal spray Place 2 sprays into the nose daily as needed (allergies).    [provider]  Multiple Vitamins-Minerals (MULTIVITAMIN WITH MINERALS) tablet Take 1 tablet by mouth daily.    [provider]  omeprazole (PRILOSEC) 40 MG capsule Take 40 mg by mouth daily.     [provider]    Physical Exam   Triage Vital Signs: ED Triage Vitals  Encounter Vitals Group     BP 07/07/23 0247 (!) 141/58     Systolic BP Percentile --      Diastolic BP Percentile --      Pulse Rate 07/07/23 0247 72     Resp 07/07/23 0247 18     Temp 07/07/23 0247 98.3 F (36.8 C)     Temp Source 07/07/23 0247 Oral     SpO2 07/07/23 0247 96 %     Weight 07/07/23 0244 147 lb (66.7 kg)     Height --      Head Circumference --      Peak Flow --      Pain Score 07/07/23 0244 0     Pain Loc --      Pain Education --      Exclude from Growth Chart --     Most recent vital signs: Vitals:   07/07/23 0247 07/07/23 0753  BP: (!) 141/58 117/63  Pulse: 72 87  Resp: 18 18  Temp: 98.3 F (36.8 C) 98 F (36.7 C)  SpO2: 96% 92%    CONSTITUTIONAL: Alert, responds appropriately to questions. Well-appearing; well-nourished HEAD: Normocephalic, atraumatic EYES: Conjunctivae clear, pupils appear equal, sclera nonicteric ENT: normal nose; moist mucous membranes NECK: Supple, normal ROM CARD: RRR; S1 and S2 appreciated RESP: Normal chest  excursion without splinting or tachypnea; breath sounds clear and equal bilaterally; no wheezes, no rhonchi, no rales, no hypoxia or respiratory distress, speaking full sentences ABD/GI: Non-distended; soft, tender throughout the right abdomen without guarding or rebound BACK: The back appears normal, significant kyphosis EXT: Normal ROM in all joints; no deformity noted, no edema SKIN: Normal color for age and race; warm; no rash on exposed skin NEURO: Moves all extremities equally, normal speech PSYCH: The patient's mood and manner are appropriate.   ED Results / Procedures / Treatments   LABS: (all labs ordered are listed, but only abnormal results are displayed) Labs Reviewed  LIPASE, BLOOD - Abnormal; Notable for the following components:  Result Value   Lipase 67 (*)    All other components within normal limits  COMPREHENSIVE METABOLIC PANEL - Abnormal; Notable for the following components:   Chloride 92 (*)    Glucose, Bld 182 (*)    Anion gap 17 (*)    All other components within normal limits  CBC - Abnormal; Notable for the following components:   WBC 15.4 (*)    All other components within normal limits  URINALYSIS, ROUTINE W REFLEX MICROSCOPIC - Abnormal; Notable for the following components:   Color, Urine YELLOW (*)    APPearance CLEAR (*)    Protein, ur 30 (*)    All other components within normal limits  RESP PANEL BY RT-PCR (RSV, FLU A&B, COVID)  [id number]     EKG:   RADIOLOGY: My personal review and interpretation of imaging: CT scan shows enteritis.  Normal appendix.  Normal gallbladder.  I have personally reviewed all radiology reports.   CT ABDOMEN PELVIS W CONTRAST Result Date: 07/07/2023 CLINICAL DATA:  Right lower quadrant pain. EXAM: CT ABDOMEN AND PELVIS WITH CONTRAST TECHNIQUE: Multidetector CT imaging of the abdomen and pelvis was performed using the standard protocol following bolus administration of intravenous contrast. RADIATION DOSE  REDUCTION: This exam was performed according to the departmental dose-optimization program which includes automated exposure control, adjustment of the mA and/or kV according to patient size and/or use of iterative reconstruction technique. CONTRAST:  OMNIPAQUE  IOHEXOL  300 MG/ML  SOLN COMPARISON:  CT with IV contrast 09/21/2022, CT with oral contrast only 11/10/2020. FINDINGS: Lower chest: There is posterior atelectasis in linear scar-like opacities in the lower lobes. Lung bases are clear of infiltrates. There is mild cardiomegaly. Small hiatal hernia. There are diffuse dystrophic calcifications in the breast tissue consistent with fibrocystic disease. Hepatobiliary: No focal liver abnormality is seen. No gallstones, gallbladder wall thickening, or biliary dilatation. Pancreas: No abnormality. Spleen: No abnormality.  No splenomegaly. Adrenals/Urinary Tract: Mild cortical scarring inferior pole right kidney. No adrenal mass. There is no renal mass enhancement. There is symmetric excretion on the delayed images. On the left, several subcentimeter cortical hypodensities consistent with Bosniak 2 cysts are again noted and too small characterize but unchanged. No follow-up imaging is required. There is no urinary stone or obstruction. The bladder is unremarkable. Stomach/Bowel: There is mild fluid volume thickened folds of the stomach, but this was seen previously also. Probable chronic gastritis. There is mucosal thickening and enhancement in the jejunum, without inflammatory change or dilatation. There is nondilated fluid filling in the lower abdominopelvic small bowel, some segments showing mucosal enhancement without thickening. Findings are compatible with a nonspecific enteritis. There is a normal caliber appendix. There is fluid in the ascending and proximal transverse colon as well. There is moderate formed stool in the mid and distal transverse colon, with the left colon and rectum mostly contracted. No  colonic wall thickening or inflammatory change. Vascular/Lymphatic: Aortic atherosclerosis. No enlarged abdominal or pelvic lymph nodes. Reproductive: Status post hysterectomy. No adnexal masses. Other: There are small inguinal fat hernias. No incarcerated hernia, free fluid or free air. Musculoskeletal: Mild levoscoliosis lumbar spine with grade 1 degenerative anterolisthesis at the lowest 2 levels, grade 1 retrolisthesis at L1-2, and acquired spinal stenosis and advanced facet hypertrophy at L4-5. Osteopenia. No new osseous findings or aggressive lesion. IMPRESSION: 1. Findings are compatible with a nonspecific enteritis, with thickening of the jejunum, nondilated fluid filling in the lower abdominopelvic small bowel, and fluid in the ascending and proximal transverse colon.  There are no mesenteric inflammatory changes or small-bowel obstruction. 2. Chronic gastritis. 3. Aortic atherosclerosis. 4. Small hiatal hernia. 5. Small inguinal fat hernias. 6. Osteopenia, scoliosis and degenerative change. Aortic Atherosclerosis (ICD10-I70.0). Electronically Signed   By: Francis Quam M.D.   On: 07/07/2023 06:52     PROCEDURES:  Critical Care performed: No     Procedures    IMPRESSION / MDM / ASSESSMENT AND PLAN / ED COURSE  I reviewed the triage vital signs and the nursing notes.    Patient here with vomiting, diarrhea, right-sided abdominal pain.  The patient is on the cardiac monitor to evaluate for evidence of arrhythmia and/or significant heart rate changes.   DIFFERENTIAL DIAGNOSIS (includes but not limited to):   Viral gastroenteritis, appendicitis, cholecystitis, pancreatitis, colitis, bowel obstruction   Patient's presentation is most consistent with acute presentation with potential threat to life or bodily function.   PLAN: Will obtain abdominal labs, urine, CT of the abdomen pelvis.  Will give IV fluids, nausea medicine.  She declines pain medication at this  time.   MEDICATIONS GIVEN IN ED: Medications  ondansetron  (ZOFRAN -ODT) disintegrating tablet 4 mg (4 mg Oral Given 07/07/23 0251)  sodium chloride  0.9 % bolus 1,000 mL (0 mLs Intravenous Stopped 07/07/23 0750)  ondansetron  (ZOFRAN ) injection 4 mg (4 mg Intravenous Given 07/07/23 0553)  iohexol  (OMNIPAQUE ) 300 MG/ML solution 100 mL (100 mLs Intravenous Contrast Given 07/07/23 0609)     ED COURSE: Labs show leukocytosis of 15,000.  Normal creatinine, LFTs.  Lipase minimally elevated.  COVID, flu and RSV negative.  Urine shows no sign of infection or dehydration.  CT scan reviewed and interpreted by myself and the radiologist and showed nonspecific enteritis consistent with a viral process.  There is no bowel obstruction.  Appendix appears normal.  Patient reports feeling better.  Tolerating p.o. here.  I feel she is safe for discharge.  Will discharge with prescription of Zofran .  Discussed supportive care instructions and return precautions.  Patient verbalized understanding and is comfortable with this plan.   At this time, I do not feel there is any life-threatening condition present. I reviewed all nursing notes, vitals, pertinent previous records.  All lab and urine results, EKGs, imaging ordered have been independently reviewed and interpreted by myself.  I reviewed all available radiology reports from any imaging ordered this visit.  Based on my assessment, I feel the patient is safe to be discharged home without further emergent workup and can continue workup as an outpatient as needed. Discussed all findings, treatment plan as well as usual and customary return precautions.  They verbalize understanding and are comfortable with this plan.  Outpatient follow-up has been provided as needed.  All questions have been answered.    CONSULTS:  none   OUTSIDE RECORDS REVIEWED: Reviewed last internal medicine note on 04/12/2023.       FINAL CLINICAL IMPRESSION(S) / ED DIAGNOSES   Final  diagnoses:  Nausea vomiting and diarrhea     Rx / DC Orders   ED Discharge Orders          Ordered    ondansetron  (ZOFRAN -ODT) 4 MG disintegrating tablet  Every 6 hours PRN        07/07/23 0656             Note:  This document was prepared using Dragon voice recognition software and may include unintentional dictation errors.   Wilmer Berryhill, Josette SAILOR, DO 07/07/23 [phone]

## 2023-07-07 NOTE — ED Notes (Signed)
 Po chal started

## 2023-07-07 NOTE — Discharge Instructions (Signed)
 You may take Tylenol  1000 mg every 6 hours as needed for fever, pain.  You may take over-the-counter Imodium as needed for diarrhea.  I recommend a bland diet for the next several days.

## 2023-09-21 ENCOUNTER — Emergency Department
Admission: EM | Admit: 2023-09-21 | Discharge: 2023-09-21 | Disposition: A | Discharge status: Home / Self Care | Attending: Emergency Medicine | Admitting: Emergency Medicine

## 2023-09-21 ENCOUNTER — Other Ambulatory Visit: Payer: Self-pay

## 2023-09-21 ENCOUNTER — Emergency Department

## 2023-09-21 ENCOUNTER — Encounter: Payer: Self-pay | Admitting: *Deleted

## 2023-09-21 DIAGNOSIS — R600 Localized edema: Secondary | ICD-10-CM | POA: Diagnosis not present

## 2023-09-21 DIAGNOSIS — M7989 Other specified soft tissue disorders: Secondary | ICD-10-CM | POA: Diagnosis present

## 2023-09-21 DIAGNOSIS — L03116 Cellulitis of left lower limb: Secondary | ICD-10-CM | POA: Diagnosis not present

## 2023-09-21 DIAGNOSIS — I10 Essential (primary) hypertension: Secondary | ICD-10-CM | POA: Insufficient documentation

## 2023-09-21 LAB — CBC
HCT: 39.1 % (ref 36.0–46.0)
Hemoglobin: 13.3 g/dL (ref 12.0–15.0)
MCH: 30 pg (ref 26.0–34.0)
MCHC: 34 g/dL (ref 30.0–36.0)
MCV: 88.1 fL (ref 80.0–100.0)
Platelets: 280 10*3/uL (ref 150–400)
RBC: 4.44 MIL/uL (ref 3.87–5.11)
RDW: 13.3 % (ref 11.5–15.5)
WBC: 7.3 10*3/uL (ref 4.0–10.5)
nRBC: 0 % (ref 0.0–0.2)

## 2023-09-21 LAB — BASIC METABOLIC PANEL WITH GFR
Anion gap: 11 (ref 5–15)
BUN: 17 mg/dL (ref 8–23)
CO2: 25 mmol/L (ref 22–32)
Calcium: 9.6 mg/dL (ref 8.9–10.3)
Chloride: 92 mmol/L — ABNORMAL LOW (ref 98–111)
Creatinine, Ser: 0.62 mg/dL (ref 0.44–1.00)
GFR, Estimated: >60 mL/min (ref >60)
Glucose, Bld: 120 mg/dL — ABNORMAL HIGH (ref 70–99)
Potassium: 5.4 mmol/L — ABNORMAL HIGH (ref 3.5–5.1)
Sodium: 128 mmol/L — ABNORMAL LOW (ref 135–145)

## 2023-09-21 LAB — BRAIN NATRIURETIC PEPTIDE: B Natriuretic Peptide: 380 pg/mL — ABNORMAL HIGH (ref 0.0–100.0)

## 2023-09-21 LAB — TROPONIN I (HIGH SENSITIVITY): Troponin I (High Sensitivity): 4 ng/L (ref <18)

## 2023-09-21 MED ORDER — CEPHALEXIN 500 MG PO CAPS
500.0000 mg | ORAL_CAPSULE | Freq: Four times a day (QID) | ORAL | 0 refills | Status: DC
Start: 2023-09-21 — End: 2023-09-30

## 2023-09-21 MED ORDER — CEPHALEXIN 500 MG PO CAPS
500.0000 mg | ORAL_CAPSULE | Freq: Once | ORAL | Status: AC
  Administered 2023-09-21: 500 mg via ORAL
  Filled 2023-09-21: qty 1

## 2023-09-21 MED ORDER — FUROSEMIDE 20 MG PO TABS: 20.0000 mg | ORAL_TABLET | Freq: Every day | ORAL | 0 refills | Status: AC

## 2023-09-21 NOTE — ED Provider Notes (Signed)
 [facility] Provider Note    Event Date/Time   First MD Initiated Contact with Patient 09/21/23 1847     (approximate)   History   Leg Swelling   HPI  Kaitlyn Nelson is a 83 y.o. female with history of hypertension, GERD, and hiatal hernia who presents with bilateral leg swelling for the last few days, worse on the left, occurring after she started on Fosamax for osteoporosis.  The patient states that the swelling is a bit worse on the left.  She has had similar episodes previously that resolved on their own, but this is the most severe episode she has had.  She denies any shortness of breath, orthopnea, or nocturnal dyspnea.  She has no chest pain.  I reviewed the past medical records.  The patient was seen at [facility] clinic today for bilateral leg swelling and was sent to the ED for rule out DVT.  Previously her most recent outpatient encounter was on 4/9 for wrist pain after a fall.   Physical Exam   Triage Vital Signs: ED Triage Vitals  Encounter Vitals Group     BP 09/21/23 1606 (!) 181/85     Systolic BP Percentile --      Diastolic BP Percentile --      Pulse Rate 09/21/23 1606 74     Resp 09/21/23 1606 20     Temp 09/21/23 1606 98 F (36.7 C)     Temp Source 09/21/23 1606 Oral     SpO2 09/21/23 1606 97 %     Weight 09/21/23 1604 154 lb (69.9 kg)     Height 09/21/23 1604 4\' 9"  (1.448 m)     Head Circumference --      Peak Flow --      Pain Score 09/21/23 1603 0     Pain Loc --      Pain Education --      Exclude from Growth Chart --     Most recent vital signs: Vitals:   09/21/23 1606 09/21/23 2014  BP: (!) 181/85 (!) 162/72  Pulse: 74 66  Resp: 20 18  Temp: 98 F (36.7 C) (!) 97.5 F (36.4 C)  SpO2: 97% 96%     General: Awake, no distress.  CV:  Good peripheral perfusion.  Resp:  Normal effort.  Lungs CTAB. Abd:  No distention.  Other:  2+ pitting edema to the left lower leg, 1+ on the right.  Slight erythema to  the medial aspect of the left lower leg.   ED Results / Procedures / Treatments   Labs (all labs ordered are listed, but only abnormal results are displayed) Labs Reviewed  BASIC METABOLIC PANEL WITH GFR - Abnormal; Notable for the following components:      Result Value   Sodium 128 (*)    Potassium 5.4 (*)    Chloride 92 (*)    Glucose, Bld 120 (*)    All other components within normal limits  BRAIN NATRIURETIC PEPTIDE - Abnormal; Notable for the following components:   B Natriuretic Peptide 380.0 (*)    All other components within normal limits  CBC  TROPONIN I (HIGH SENSITIVITY)     EKG  ED ECG REPORT I, Lind Repine, the attending physician, personally viewed and interpreted this ECG.  Date: 09/21/2023 EKG Time: 1609 Rate: 71 Rhythm: normal sinus rhythm QRS Axis: normal Intervals: normal ST/T Wave abnormalities: normal Narrative Interpretation: no evidence of acute ischemia    RADIOLOGY  Chest x-ray: I independently viewed and interpreted the images; there is no focal consolidation or edema  PROCEDURES:  Critical Care performed: No  Procedures   MEDICATIONS ORDERED IN ED: Medications  cephALEXin  (KEFLEX ) capsule 500 mg (has no administration in time range)     IMPRESSION / MDM / ASSESSMENT AND PLAN / ED COURSE  I reviewed the triage vital signs and the nursing notes.  83 year old female with PMH as noted above presents with bilateral lower extremity swelling over the last few weeks, though she has had similar episodes previously.  She has no respiratory symptoms.  On exam she is hypertensive with otherwise normal vital signs.  Lungs are clear to auscultation.  There is edema to both legs, slightly worse on the left, with also an area of very faint erythema to the medial aspect of the left lower leg.  Differential diagnosis includes, but is not limited to, dependent edema, lymphedema, venous stasis, CHF, DVT, cellulitis.  BNP is mildly elevated.   Chest x-ray is clear with no evidence of edema.  Troponin is negative.  Sodium is borderline low.  I have added on a DVT study for further evaluation.  Patient's presentation is most consistent with acute complicated illness / injury requiring diagnostic workup.  ----------------------------------------- 11:11 PM on 09/21/2023 -----------------------------------------  DVT study result is pending.  The patient has been signed out to the oncoming ED physician.  FINAL CLINICAL IMPRESSION(S) / ED DIAGNOSES   Final diagnoses:  Leg swelling  Cellulitis of left lower extremity     Rx / DC Orders   ED Discharge Orders          Ordered    furosemide  (LASIX ) 20 MG tablet  Daily        09/21/23 2304    cephALEXin  (KEFLEX ) 500 MG capsule  4 times daily        09/21/23 2304             Note:  This document was prepared using Dragon voice recognition software and may include unintentional dictation errors.    Lind Repine, MD 09/21/23 2312

## 2023-09-21 NOTE — Discharge Instructions (Addendum)
 Take the Lasix  daily for the next 5 days.  This will help drain some of the fluid out of your legs.  Take the antibiotic (cephalexin ) also for the full 5-day course.  You likely have the beginning of an infection in the skin of your left leg where it is red.  Your ultrasound showed no blood clots today.  Follow-up with your primary care provider.  Return to the ER for new, worsening, or persistent severe pain, swelling, shortness of breath, weakness, fever, or any other new or worsening symptoms that concern you.

## 2023-09-21 NOTE — ED Triage Notes (Signed)
 Pt to triage via wheelchair.  Pt has swelling to both lower legs for 1 week.   No chest pain .  Pt denies sob. Pt sent from [facility] for eval.  Pt alert  speech clear.

## 2023-09-26 ENCOUNTER — Inpatient Hospital Stay
Admission: EM | Admit: 2023-09-26 | Discharge: 2023-09-30 | DRG: 603 | Disposition: A | Discharge status: Home / Self Care | Attending: Internal Medicine | Admitting: Internal Medicine

## 2023-09-26 ENCOUNTER — Other Ambulatory Visit: Payer: Self-pay

## 2023-09-26 ENCOUNTER — Emergency Department

## 2023-09-26 DIAGNOSIS — I959 Hypotension, unspecified: Secondary | ICD-10-CM | POA: Diagnosis present

## 2023-09-26 DIAGNOSIS — Z881 Allergy status to other antibiotic agents status: Secondary | ICD-10-CM

## 2023-09-26 DIAGNOSIS — L03116 Cellulitis of left lower limb: Secondary | ICD-10-CM | POA: Diagnosis not present

## 2023-09-26 DIAGNOSIS — Z6833 Body mass index (BMI) 33.0-33.9, adult: Secondary | ICD-10-CM

## 2023-09-26 DIAGNOSIS — I10 Essential (primary) hypertension: Secondary | ICD-10-CM | POA: Insufficient documentation

## 2023-09-26 DIAGNOSIS — Z880 Allergy status to penicillin: Secondary | ICD-10-CM

## 2023-09-26 DIAGNOSIS — Z79899 Other long term (current) drug therapy: Secondary | ICD-10-CM

## 2023-09-26 DIAGNOSIS — I110 Hypertensive heart disease with heart failure: Secondary | ICD-10-CM | POA: Diagnosis present

## 2023-09-26 DIAGNOSIS — Z882 Allergy status to sulfonamides status: Secondary | ICD-10-CM

## 2023-09-26 DIAGNOSIS — E875 Hyperkalemia: Secondary | ICD-10-CM | POA: Diagnosis present

## 2023-09-26 DIAGNOSIS — Z886 Allergy status to analgesic agent status: Secondary | ICD-10-CM

## 2023-09-26 DIAGNOSIS — E669 Obesity, unspecified: Secondary | ICD-10-CM | POA: Diagnosis present

## 2023-09-26 DIAGNOSIS — G4733 Obstructive sleep apnea (adult) (pediatric): Secondary | ICD-10-CM | POA: Diagnosis present

## 2023-09-26 DIAGNOSIS — I5032 Chronic diastolic (congestive) heart failure: Secondary | ICD-10-CM | POA: Insufficient documentation

## 2023-09-26 DIAGNOSIS — Z961 Presence of intraocular lens: Secondary | ICD-10-CM | POA: Diagnosis present

## 2023-09-26 DIAGNOSIS — Z90710 Acquired absence of both cervix and uterus: Secondary | ICD-10-CM

## 2023-09-26 DIAGNOSIS — Z888 Allergy status to other drugs, medicaments and biological substances status: Secondary | ICD-10-CM

## 2023-09-26 DIAGNOSIS — E871 Hypo-osmolality and hyponatremia: Secondary | ICD-10-CM | POA: Insufficient documentation

## 2023-09-26 LAB — COMPREHENSIVE METABOLIC PANEL WITH GFR
ALT: 23 U/L (ref 0–44)
AST: 26 U/L (ref 15–41)
Albumin: 3.8 g/dL (ref 3.5–5.0)
Alkaline Phosphatase: 73 U/L (ref 38–126)
Anion gap: 10 (ref 5–15)
BUN: 21 mg/dL (ref 8–23)
CO2: 28 mmol/L (ref 22–32)
Calcium: 9.4 mg/dL (ref 8.9–10.3)
Chloride: 93 mmol/L — ABNORMAL LOW (ref 98–111)
Creatinine, Ser: 0.77 mg/dL (ref 0.44–1.00)
GFR, Estimated: >60 mL/min (ref >60)
Glucose, Bld: 108 mg/dL — ABNORMAL HIGH (ref 70–99)
Potassium: 5.1 mmol/L (ref 3.5–5.1)
Sodium: 131 mmol/L — ABNORMAL LOW (ref 135–145)
Total Bilirubin: 0.4 mg/dL (ref 0.0–1.2)
Total Protein: 7.6 g/dL (ref 6.5–8.1)

## 2023-09-26 LAB — CBC WITH DIFFERENTIAL/PLATELET
Abs Immature Granulocytes: 0.01 10*3/uL (ref 0.00–0.07)
Basophils Absolute: 0 10*3/uL (ref 0.0–0.1)
Basophils Relative: 0 %
Eosinophils Absolute: 0.2 10*3/uL (ref 0.0–0.5)
Eosinophils Relative: 2 %
HCT: 38 % (ref 36.0–46.0)
Hemoglobin: 12.7 g/dL (ref 12.0–15.0)
Immature Granulocytes: 0 %
Lymphocytes Relative: 22 %
Lymphs Abs: 1.5 10*3/uL (ref 0.7–4.0)
MCH: 30 pg (ref 26.0–34.0)
MCHC: 33.4 g/dL (ref 30.0–36.0)
MCV: 89.6 fL (ref 80.0–100.0)
Monocytes Absolute: 0.9 10*3/uL (ref 0.1–1.0)
Monocytes Relative: 13 %
Neutro Abs: 4.3 10*3/uL (ref 1.7–7.7)
Neutrophils Relative %: 63 %
Platelets: 274 10*3/uL (ref 150–400)
RBC: 4.24 MIL/uL (ref 3.87–5.11)
RDW: 13.3 % (ref 11.5–15.5)
WBC: 6.9 10*3/uL (ref 4.0–10.5)
nRBC: 0 % (ref 0.0–0.2)

## 2023-09-26 LAB — LACTIC ACID, PLASMA: Lactic Acid, Venous: 1.6 mmol/L (ref 0.5–1.9)

## 2023-09-26 NOTE — ED Provider Notes (Signed)
 [facility] Provider Note    Event Date/Time   First MD Initiated Contact with Patient 09/26/23 2128     (approximate)   History   Leg Swelling   HPI  Tristan Kammerman is a 83 y.o. female presents to the emergency department with leg swelling.  Patient states that she has recently had swelling to both of her legs and was seen in the emergency department.  States that her right leg has improved however she has ongoing swelling and pain in her left leg which brought her to the emergency department.  Denies fever or chills.  Denies nausea, vomiting, chest pain or shortness of breath.  States that she was recently evaluated in the emergency department and started on antibiotic and she has been taking it every day.  Has a follow-up appoint with her primary care physician tomorrow.  On chart review patient was evaluated on 09/21/2023 and had bilateral lower extremity DVT scan that was negative and was ultimately started on Keflex  to treat lower leg cellulitis.     Physical Exam   Triage Vital Signs: ED Triage Vitals  Encounter Vitals Group     BP 09/26/23 1807 (!) 162/67     Systolic BP Percentile --      Diastolic BP Percentile --      Pulse Rate 09/26/23 1807 72     Resp 09/26/23 1807 17     Temp 09/26/23 1807 98.1 F (36.7 C)     Temp Source 09/26/23 1807 Oral     SpO2 09/26/23 1807 94 %     Weight 09/26/23 1808 156 lb (70.8 kg)     Height 09/26/23 1808 4\' 9"  (1.448 m)     Head Circumference --      Peak Flow --      Pain Score 09/26/23 1816 6     Pain Loc --      Pain Education --      Exclude from Growth Chart --     Most recent vital signs: Vitals:   09/26/23 2230 09/26/23 2300  BP: (!) 165/82 (!) 129/58  Pulse: 79 68  Resp: 16 16  Temp:    SpO2: 100% 98%    Physical Exam Constitutional:      Appearance: She is well-developed.  HENT:     Head: Atraumatic.  Eyes:     Conjunctiva/sclera: Conjunctivae normal.  Cardiovascular:      Rate and Rhythm: Regular rhythm.  Pulmonary:     Effort: No respiratory distress.  Abdominal:     General: There is no distension.  Musculoskeletal:        General: Normal range of motion.     Cervical back: Normal range of motion.     Comments: Left lower extremity with significant swelling and edema when compared to the right lower extremity.  Some red streaking up the left leg.  +2 DP pulses that are equal bilaterally.  No crepitance.  Skin:    General: Skin is warm.  Neurological:     Mental Status: She is alert. Mental status is at baseline.  Psychiatric:        Mood and Affect: Mood normal.     IMPRESSION / MDM / ASSESSMENT AND PLAN / ED COURSE  I reviewed the triage vital signs and the nursing notes.  Differential diagnosis including DVT, cellulitis.  I have a low suspicion for necrotizing soft tissue infection.  No pain out of proportion and no fever or systemic  symptoms.   Low suspicion for heart failure exacerbation, no shortness of breath and no history of heart failure.  Has good intact pulses.    RADIOLOGY Ultrasound left lower extremity with no acute DVT  LABS (all labs ordered are listed, but only abnormal results are displayed) Labs interpreted as -    Labs Reviewed  COMPREHENSIVE METABOLIC PANEL WITH GFR - Abnormal; Notable for the following components:      Result Value   Sodium 131 (*)    Chloride 93 (*)    Glucose, Bld 108 (*)    All other components within normal limits  CBC WITH DIFFERENTIAL/PLATELET  LACTIC ACID, PLASMA     MDM    No significant leukocytosis or electrolyte abnormality.  Creatinine at baseline.  Normal lactic acid.  Concern for cellulitis that has failed outpatient treatment.  Otherwise does not meet any criteria for sepsis.  Will start the patient on IV vancomycin and Rocephin given her Augmentin allergy.  Recommended admission to the patient.    PROCEDURES:  Critical Care performed: No  Procedures  Patient's  presentation is most consistent with acute presentation with potential threat to life or bodily function.   MEDICATIONS ORDERED IN ED: Medications  vancomycin (VANCOCIN) 1,420 mg in sodium chloride  0.9 % 500 mL IVPB (has no administration in time range)  cefTRIAXone (ROCEPHIN) 2 g in sodium chloride  0.9 % 100 mL IVPB (has no administration in time range)    FINAL CLINICAL IMPRESSION(S) / ED DIAGNOSES   Final diagnoses:  Left leg cellulitis     Rx / DC Orders   ED Discharge Orders     None        Note:  This document was prepared using Dragon voice recognition software and may include unintentional dictation errors.   Viviano Ground, MD 09/27/23 0005

## 2023-09-26 NOTE — ED Triage Notes (Signed)
 Patient comes in via pov from [facility] due to swelling and redness in both legs. Pt states that her leg has been swollen for about a week or two. Patient complains of pain 6/10 in both legs, and states that her feet are sore as well. Pt was recently seen in the ER for the same leg swelling, but still has no relief. Pt is alert and oriented x4 with no signs of acute distress at this time.

## 2023-09-26 NOTE — ED Triage Notes (Signed)
 First nurse note: Brought over from [facility]. Seen on 4/23 for cellulitis. Non compliant with medications.   Legs swollen and red.   KC vitals: 164/89 b/p 75HR 94% RA 97.6oral

## 2023-09-27 DIAGNOSIS — E871 Hypo-osmolality and hyponatremia: Secondary | ICD-10-CM | POA: Diagnosis present

## 2023-09-27 DIAGNOSIS — Z79899 Other long term (current) drug therapy: Secondary | ICD-10-CM | POA: Diagnosis not present

## 2023-09-27 DIAGNOSIS — I110 Hypertensive heart disease with heart failure: Secondary | ICD-10-CM | POA: Diagnosis present

## 2023-09-27 DIAGNOSIS — L03116 Cellulitis of left lower limb: Secondary | ICD-10-CM | POA: Diagnosis present

## 2023-09-27 DIAGNOSIS — I5032 Chronic diastolic (congestive) heart failure: Secondary | ICD-10-CM | POA: Diagnosis present

## 2023-09-27 DIAGNOSIS — I959 Hypotension, unspecified: Secondary | ICD-10-CM | POA: Diagnosis present

## 2023-09-27 DIAGNOSIS — E669 Obesity, unspecified: Secondary | ICD-10-CM | POA: Diagnosis present

## 2023-09-27 DIAGNOSIS — Z882 Allergy status to sulfonamides status: Secondary | ICD-10-CM | POA: Diagnosis not present

## 2023-09-27 DIAGNOSIS — Z881 Allergy status to other antibiotic agents status: Secondary | ICD-10-CM | POA: Diagnosis not present

## 2023-09-27 DIAGNOSIS — G4733 Obstructive sleep apnea (adult) (pediatric): Secondary | ICD-10-CM | POA: Diagnosis present

## 2023-09-27 DIAGNOSIS — Z880 Allergy status to penicillin: Secondary | ICD-10-CM | POA: Diagnosis not present

## 2023-09-27 DIAGNOSIS — Z90710 Acquired absence of both cervix and uterus: Secondary | ICD-10-CM | POA: Diagnosis not present

## 2023-09-27 DIAGNOSIS — Z886 Allergy status to analgesic agent status: Secondary | ICD-10-CM | POA: Diagnosis not present

## 2023-09-27 DIAGNOSIS — Z888 Allergy status to other drugs, medicaments and biological substances status: Secondary | ICD-10-CM | POA: Diagnosis not present

## 2023-09-27 DIAGNOSIS — Z6833 Body mass index (BMI) 33.0-33.9, adult: Secondary | ICD-10-CM | POA: Diagnosis not present

## 2023-09-27 DIAGNOSIS — E875 Hyperkalemia: Secondary | ICD-10-CM | POA: Diagnosis present

## 2023-09-27 DIAGNOSIS — I10 Essential (primary) hypertension: Secondary | ICD-10-CM | POA: Insufficient documentation

## 2023-09-27 DIAGNOSIS — Z961 Presence of intraocular lens: Secondary | ICD-10-CM | POA: Diagnosis present

## 2023-09-27 MED ORDER — ALBUTEROL SULFATE (2.5 MG/3ML) 0.083% IN NEBU: 2.5000 mg | INHALATION_SOLUTION | RESPIRATORY_TRACT | Status: DC | PRN

## 2023-09-27 MED ORDER — ONDANSETRON HCL 4 MG PO TABS
4.0000 mg | ORAL_TABLET | Freq: Four times a day (QID) | ORAL | Status: DC | PRN
Start: 2023-09-27 — End: 2023-09-30

## 2023-09-27 MED ORDER — GABAPENTIN 100 MG PO CAPS
200.0000 mg | ORAL_CAPSULE | Freq: Once | ORAL | Status: AC
  Administered 2023-09-27: 200 mg via ORAL
  Filled 2023-09-27: qty 2

## 2023-09-27 MED ORDER — METOPROLOL TARTRATE 50 MG PO TABS
75.0000 mg | ORAL_TABLET | Freq: Once | ORAL | Status: AC
  Administered 2023-09-27: 75 mg via ORAL
  Filled 2023-09-27: qty 1

## 2023-09-27 MED ORDER — ACETAMINOPHEN 650 MG RE SUPP
650.0000 mg | Freq: Four times a day (QID) | RECTAL | Status: DC | PRN
Start: 2023-09-27 — End: 2023-09-30

## 2023-09-27 MED ORDER — SODIUM CHLORIDE 0.9 % IV SOLN
1.0000 g | INTRAVENOUS | Status: DC
  Administered 2023-09-27 – 2023-09-29 (×3): 1 g via INTRAVENOUS
  Filled 2023-09-27 (×3): qty 10

## 2023-09-27 MED ORDER — SODIUM CHLORIDE 0.9 % IV SOLN
2.0000 g | Freq: Once | INTRAVENOUS | Status: AC
  Administered 2023-09-27: 2 g via INTRAVENOUS
  Filled 2023-09-27: qty 20

## 2023-09-27 MED ORDER — ONDANSETRON HCL 4 MG/2ML IJ SOLN
4.0000 mg | Freq: Four times a day (QID) | INTRAMUSCULAR | Status: DC | PRN
Start: 2023-09-27 — End: 2023-09-30

## 2023-09-27 MED ORDER — ORAL CARE MOUTH RINSE: 15.0000 mL | OROMUCOSAL | Status: DC | PRN

## 2023-09-27 MED ORDER — VANCOMYCIN HCL 1500 MG/300ML IV SOLN
1500.0000 mg | Freq: Once | INTRAVENOUS | Status: AC
  Administered 2023-09-27: 1500 mg via INTRAVENOUS
  Filled 2023-09-27: qty 300

## 2023-09-27 MED ORDER — HYDROCODONE-ACETAMINOPHEN 5-325 MG PO TABS
1.0000 | ORAL_TABLET | ORAL | Status: DC | PRN
Start: 2023-09-27 — End: 2023-09-30

## 2023-09-27 MED ORDER — FUROSEMIDE 20 MG PO TABS
20.0000 mg | ORAL_TABLET | Freq: Every day | ORAL | Status: DC
  Administered 2023-09-27 – 2023-09-30 (×4): 20 mg via ORAL
  Filled 2023-09-27 (×4): qty 1

## 2023-09-27 MED ORDER — METOPROLOL TARTRATE 50 MG PO TABS
100.0000 mg | ORAL_TABLET | Freq: Two times a day (BID) | ORAL | Status: DC
  Administered 2023-09-28 – 2023-09-30 (×5): 100 mg via ORAL
  Filled 2023-09-27 (×5): qty 2

## 2023-09-27 MED ORDER — VANCOMYCIN HCL 750 MG/150ML IV SOLN: 750.0000 mg | INTRAVENOUS | Status: DC

## 2023-09-27 MED ORDER — GABAPENTIN 100 MG PO CAPS
100.0000 mg | ORAL_CAPSULE | Freq: Every day | ORAL | Status: DC
  Administered 2023-09-28 – 2023-09-29 (×2): 100 mg via ORAL
  Filled 2023-09-27 (×2): qty 1

## 2023-09-27 MED ORDER — ACETAMINOPHEN 325 MG PO TABS: 650.0000 mg | ORAL_TABLET | Freq: Four times a day (QID) | ORAL | Status: DC | PRN

## 2023-09-27 MED ORDER — IRBESARTAN 150 MG PO TABS
300.0000 mg | ORAL_TABLET | Freq: Every day | ORAL | Status: DC
  Filled 2023-09-27: qty 2

## 2023-09-27 MED ORDER — SODIUM CHLORIDE 0.9 % IV BOLUS
250.0000 mL | Freq: Once | INTRAVENOUS | Status: AC
  Administered 2023-09-27: 250 mL via INTRAVENOUS

## 2023-09-27 MED ORDER — METOPROLOL TARTRATE 50 MG PO TABS: 75.0000 mg | ORAL_TABLET | Freq: Two times a day (BID) | ORAL | Status: DC

## 2023-09-27 MED ORDER — FUROSEMIDE 40 MG PO TABS
20.0000 mg | ORAL_TABLET | Freq: Every day | ORAL | Status: DC
Start: 2023-09-27 — End: 2023-09-27

## 2023-09-27 MED ORDER — LOSARTAN POTASSIUM 50 MG PO TABS: 100.0000 mg | ORAL_TABLET | Freq: Every day | ORAL | Status: DC

## 2023-09-27 MED ORDER — METOPROLOL TARTRATE 25 MG PO TABS: 100.0000 mg | ORAL_TABLET | Freq: Two times a day (BID) | ORAL | Status: DC

## 2023-09-27 MED ORDER — MORPHINE SULFATE (PF) 2 MG/ML IV SOLN: 2.0000 mg | INTRAVENOUS | Status: DC | PRN

## 2023-09-27 MED ORDER — ENOXAPARIN SODIUM 40 MG/0.4ML IJ SOSY
40.0000 mg | PREFILLED_SYRINGE | INTRAMUSCULAR | Status: DC
  Administered 2023-09-27 – 2023-09-30 (×4): 40 mg via SUBCUTANEOUS
  Filled 2023-09-27 (×4): qty 0.4

## 2023-09-27 NOTE — Assessment & Plan Note (Signed)
 Sodium of 131 Likely hypovolemic related to taking both furosemide  and HCTZ Monitor

## 2023-09-27 NOTE — Progress Notes (Addendum)
 Progress Note    Kaitlyn Nelson  EPP:[id number] DOB: 04-20-1941  DOA: 09/26/2023 PCP: Antonio Baumgarten, MD      Brief Narrative:    Medical records reviewed and are as summarized below:  Kaitlyn Nelson is a 82 y.o. female  with medical history significant for HTN, OA, HFpEF (EF 55%), chronic exertional dyspnea with ischemic stress test November 2024, who presented to the hospital with painful swelling and redness of the left leg.  She has had bilateral lower extremity swelling noticed more swelling of the left leg associated with redness and pain.  She had taken Keflex  that was prescribed when she came to the ED on 09/21/2023.  However, symptoms have not improved.  Initial BP on admission was 162/67 but later blood pressure dropped to 86/44 in the ED.  Other vital signs were stable.  She was afebrile.  She was admitted to the hospital for left leg cellulitis.       Assessment/Plan:   Principal Problem:   Cellulitis of left lower extremity Active Problems:   Chronic heart failure with preserved ejection fraction (HFpEF) (HCC)   Hypertension   Hyponatremia   Body mass index is 33.76 kg/m.  (Obesity)   Left lower extremity cellulitis, nonpurulent: Discontinue vancomycin.  Continue IV ceftriaxone.   Hyponatremia: Improving.  Sodium up from 128-131. Hyperkalemia: Improved.  Potassium down from 5.4-5.1.   Hypotension: Hold metoprolol , amlodipine, valsartan and HCTZ   Chronic diastolic CHF: Continue oral Lasix .    Diet Order             Diet Heart Room service appropriate? Yes; Fluid consistency: Thin  Diet effective now                            Consultants: None  Procedures: None    Medications:    enoxaparin (LOVENOX) injection  40 mg Subcutaneous Q24H   furosemide   20 mg Oral Daily   [START ON 09/28/2023] gabapentin   100 mg Oral QHS   [START ON 09/28/2023] metoprolol  tartrate  100 mg Oral BID   Continuous  Infusions:  cefTRIAXone (ROCEPHIN)  IV     vancomycin       Anti-infectives (From admission, onward)    Start     Dose/Rate Route Frequency Ordered Stop   09/27/23 2200  cefTRIAXone (ROCEPHIN) 1 g in sodium chloride  0.9 % 100 mL IVPB        1 g 200 mL/hr over 30 Minutes Intravenous Every 24 hours 09/27/23 0219     09/27/23 2200  vancomycin (VANCOREADY) IVPB 750 mg/150 mL        750 mg 150 mL/hr over 60 Minutes Intravenous Every 24 hours 09/27/23 0234 10/04/23 2159   09/27/23 0015  vancomycin (VANCOREADY) IVPB 1500 mg/300 mL        1,500 mg 150 mL/hr over 120 Minutes Intravenous  Once 09/27/23 0001 09/27/23 0303   09/27/23 0015  cefTRIAXone (ROCEPHIN) 2 g in sodium chloride  0.9 % 100 mL IVPB        2 g 200 mL/hr over 30 Minutes Intravenous  Once 09/27/23 0001 09/27/23 0117              Family Communication/Anticipated D/C date and plan/Code Status   DVT prophylaxis: enoxaparin (LOVENOX) injection 40 mg Start: 09/27/23 1000     Code Status: Full Code  Family Communication: None Disposition Plan: Plan to discharge home   Status is: Inpatient Remains  inpatient appropriate because: Cellulitis left leg       Subjective:   Interval events noted.  She complains of pain in the left leg.  Devyn, RN, was at the bedside  Objective:    Vitals:   09/27/23 0604 09/27/23 0817 09/27/23 0852 09/27/23 0929  BP: (!) 96/53 (!) 98/50  128/65  Pulse:  67  79  Resp:  19  20  Temp:   97.9 F (36.6 C)   TempSrc:   Oral   SpO2:  93%  95%  Weight:      Height:       No data found.   Intake/Output Summary (Last 24 hours) at 09/27/2023 1105 Last data filed at 09/27/2023 0604 Gross per 24 hour  Intake 250 ml  Output --  Net 250 ml   Filed Weights   09/26/23 1808  Weight: 70.8 kg    Exam:  GEN: NAD SKIN: Warm and dry EYES: No pallor or icterus ENT: MMM CV: RRR PULM: CTA B ABD: soft, obese, NT, +BS CNS: AAO x 3, non focal EXT: Bilateral leg and pedal edema.   Erythema and tenderness of left leg        Data Reviewed:   I have personally reviewed following labs and imaging studies:  Labs: Labs show the following:   Basic Metabolic Panel: Recent Labs  Lab 09/21/23 1606 09/26/23 1817  NA 128* 131*  K 5.4* 5.1  CL 92* 93*  CO2 25 28  GLUCOSE 120* 108*  BUN 17 21  CREATININE 0.62 0.77  CALCIUM 9.6 9.4   GFR Estimated Creatinine Clearance: 44.8 mL/min (by C-G formula based on SCr of 0.77 mg/dL). Liver Function Tests: Recent Labs  Lab 09/26/23 1817  AST 26  ALT 23  ALKPHOS 73  BILITOT 0.4  PROT 7.6  ALBUMIN 3.8   No results for input(s): "LIPASE", "AMYLASE" in the last 168 hours. No results for input(s): "AMMONIA" in the last 168 hours. Coagulation profile No results for input(s): "INR", "PROTIME" in the last 168 hours.  CBC: Recent Labs  Lab 09/21/23 1606 09/26/23 1817  WBC 7.3 6.9  NEUTROABS  --  4.3  HGB 13.3 12.7  HCT 39.1 38.0  MCV 88.1 89.6  PLT 280 274   Cardiac Enzymes: No results for input(s): "CKTOTAL", "CKMB", "CKMBINDEX", "TROPONINI" in the last 168 hours. BNP (last 3 results) No results for input(s): "PROBNP" in the last 8760 hours. CBG: No results for input(s): "GLUCAP" in the last 168 hours. D-Dimer: No results for input(s): "DDIMER" in the last 72 hours. Hgb A1c: No results for input(s): "HGBA1C" in the last 72 hours. Lipid Profile: No results for input(s): "CHOL", "HDL", "LDLCALC", "TRIG", "CHOLHDL", "LDLDIRECT" in the last 72 hours. Thyroid function studies: No results for input(s): "TSH", "T4TOTAL", "T3FREE", "THYROIDAB" in the last 72 hours.  Invalid input(s): "FREET3" Anemia work up: No results for input(s): "VITAMINB12", "FOLATE", "FERRITIN", "TIBC", "IRON", "RETICCTPCT" in the last 72 hours. Sepsis Labs: Recent Labs  Lab 09/21/23 1606 09/26/23 1817  WBC 7.3 6.9  LATICACIDVEN  --  1.6    Microbiology No results found for this or any previous visit (from the past 240  hours).  Procedures and diagnostic studies:  US  Venous Img Lower Unilateral Left Result Date: 09/26/2023 CLINICAL DATA:  Left lower extremity swelling EXAM: Left LOWER EXTREMITY VENOUS DOPPLER ULTRASOUND TECHNIQUE: Gray-scale sonography with compression, as well as color and duplex ultrasound, were performed to evaluate the deep venous system(s) from the level of the common  femoral vein through the popliteal and proximal calf veins. COMPARISON:  Ultrasound 09/21/2023 FINDINGS: VENOUS Normal compressibility of the common femoral, superficial femoral, and popliteal veins, as well as the visualized calf veins. Visualized portions of profunda femoral vein and great saphenous vein unremarkable. No filling defects to suggest DVT on grayscale or color Doppler imaging. Doppler waveforms show normal direction of venous flow, normal respiratory plasticity and response to augmentation. Limited views of the contralateral common femoral vein are unremarkable. OTHER Subcutaneous edema in the left calf. Limitations: none IMPRESSION: Negative for acute DVT. Electronically Signed   By: Rozell Cornet M.D.   On: 09/26/2023 23:38               LOS: 0 days   Camani Sesay  [facility]   Pager on [email or web address]. If 7PM-7AM, please contact night-coverage at [email or web address]     09/27/2023, 11:05 AM

## 2023-09-27 NOTE — Progress Notes (Signed)
 Pharmacy Antibiotic Note  Kaitlyn Nelson is a 83 y.o. female admitted on 09/26/2023 with cellulitis.  Pharmacy has been consulted for Vancomycin dosing for 7 days.  Plan: Pt given Vancomycin 1500 mg once. Vancomycin 750 mg IV Q 24 hrs. Goal AUC 400-550. Expected AUC: 455.6 SCr used: 0.8 (4/28 SCr = 0.77)  Pharmacy will continue to follow and will adjust abx dosing whenever warranted.  Temp (24hrs), Avg:98.1 F (36.7 C), Min:98.1 F (36.7 C), Max:98.1 F (36.7 C)   Recent Labs  Lab 09/21/23 1606 09/26/23 1817  WBC 7.3 6.9  CREATININE 0.62 0.77  LATICACIDVEN  --  1.6    Estimated Creatinine Clearance: 44.8 mL/min (by C-G formula based on SCr of 0.77 mg/dL).    Allergies  Allergen Reactions   Aspirin     Stomach pain   Augmentin [Amoxicillin-Pot Clavulanate] Diarrhea   Compazine [Prochlorperazine Edisylate]     Pt states she does not remember    Doxycycline Nausea Only    GI UPSET   Ibuprofen      Hurts pt's stomach    Triamterene    Sulfa Antibiotics Rash    Antimicrobials this admission: 4/29 Ceftriaxone >> 4/29 Vancomycin >> x 7 days  Microbiology results: No lab cx currently ordered or pending at this time.  Thank you for allowing pharmacy to be a part of this patient's care.  Coretta Dexter, PharmD, [facility] 09/27/2023 2:29 AM

## 2023-09-27 NOTE — Assessment & Plan Note (Signed)
 Continue Rocephin and vancomycin Keep leg elevated Pain control

## 2023-09-27 NOTE — Assessment & Plan Note (Addendum)
 Slightly elevated blood pressure Continue home Lasix  l, ARB and metoprolol .  Hold hydrochlorothiazide Addendum:  Hypotension -Postadmission, patient had a drop in blood pressure, suspect secondary to medications administered overnight -Hold home antihypertensives and resume as appropriate -NS bolus of 500 mL and reassess -Not meeting sepsis criteria    09/27/2023    5:16 AM 09/27/2023    1:30 AM 09/27/2023    1:00 AM  Vitals with BMI  Systolic 86 134 132  Diastolic 44 48 59  Pulse 58 75 81

## 2023-09-27 NOTE — H&P (Signed)
 History and Physical    Patient: Kaitlyn Nelson ZOX:[id number] DOB: May 30, 1941 DOA: 09/26/2023 DOS: the patient was seen and examined on 09/27/2023 PCP: Antonio Baumgarten, MD  Patient coming from: Home  Chief Complaint:  Chief Complaint  Patient presents with   Leg Swelling    HPI: Kaitlyn Nelson is a 83 y.o. female with medical history significant for HTN, OA, HFpEF (EF 55%), chronic exertional dyspnea with ischemic stress test November 2024, being admitted for left lower extremity cellulitis failing outpatient treatment with Keflex  prescribed on 4/23.  Patient had been having bilateral lower extremity swelling but now she has pain and worsening swelling in the right leg compared to the left.  She denies fever or chills.  Has baseline dyspnea, no worse.  She was seen in the ED on 4/23 when she had bilateral lower extremity venous ultrasound that was negative for DVT and Keflex  was ordered at the time. ED course and data review: BP 162/67 with otherwise normal vitals. Labs including CBC, lactic acid and CMP unremarkable except for slightly low sodium of 131  Repeat lower extremity venous Doppler negative for DVT  Patient started on Rocephin and vancomycin  Hospitalist consulted for admission.     Review of Systems: As mentioned in the history of present illness. All other systems reviewed and are negative.  Past Medical History:  Diagnosis Date   Arthritis    Benign fundic gland polyps of stomach    BPPV (benign paroxysmal positional vertigo)    Carotid artery stenosis    Environmental and seasonal allergies    Esophagitis    GERD (gastroesophageal reflux disease)    Heart murmur    HH (hiatus hernia)    HOH (hard of hearing)    Hypertension    Lower extremity edema    Neuropathy    Osteoporosis    Seasonal allergies    Sleep apnea    CPAP   Past Surgical History:  Procedure Laterality Date   ABDOMINAL HYSTERECTOMY     BREAST CYST ASPIRATION Right     neg   CATARACT EXTRACTION W/PHACO Left 06/20/2018   Procedure: CATARACT EXTRACTION PHACO AND INTRAOCULAR LENS PLACEMENT (IOC) LEFT;  Surgeon: Annell Kidney, MD;  Location: [facility] ORS;  Service: Ophthalmology;  Laterality: Left;  US  00:47.6 AP% 12.3 CDE 3.19 Fluid Pack Lot # [id number]   CATARACT EXTRACTION W/PHACO Right 12/13/2018   Procedure: CATARACT EXTRACTION PHACO AND INTRAOCULAR LENS PLACEMENT (IOC)RIGHT;  Surgeon: Annell Kidney, MD;  Location: [facility];  Service: Ophthalmology;  Laterality: Right;  sleep apnea   COLONOSCOPY WITH PROPOFOL  N/A 06/18/2016   Procedure: COLONOSCOPY WITH PROPOFOL ;  Surgeon: Deveron Fly, MD;  Location: [facility] ENDOSCOPY;  Service: Endoscopy;  Laterality: N/A;   COLONOSCOPY WITH PROPOFOL  N/A 04/20/2021   Procedure: COLONOSCOPY WITH PROPOFOL ;  Surgeon: Quintin Buckle, DO;  Location: [facility] ENDOSCOPY;  Service: Endoscopy;  Laterality: N/A;   EAR EXAMINATION UNDER ANESTHESIA     ESOPHAGOGASTRODUODENOSCOPY (EGD) WITH PROPOFOL  N/A 06/18/2016   Procedure: ESOPHAGOGASTRODUODENOSCOPY (EGD) WITH PROPOFOL ;  Surgeon: Deveron Fly, MD;  Location: [facility] ENDOSCOPY;  Service: Endoscopy;  Laterality: N/A;   ESOPHAGOGASTRODUODENOSCOPY (EGD) WITH PROPOFOL  N/A 04/20/2021   Procedure: ESOPHAGOGASTRODUODENOSCOPY (EGD) WITH PROPOFOL ;  Surgeon: Quintin Buckle, DO;  Location: [facility] ENDOSCOPY;  Service: Endoscopy;  Laterality: N/A;   EYE SURGERY     INNER EAR SURGERY     tympanoplasty with mastoidectomy with ossicular chain reconstruction Right    Social History:  reports that she has never  smoked. She has never used smokeless tobacco. She reports that she does not drink alcohol and does not use drugs.  Allergies  Allergen Reactions   Aspirin     Stomach pain   Augmentin [Amoxicillin-Pot Clavulanate] Diarrhea   Compazine [Prochlorperazine Edisylate]     Pt states she does not remember    Doxycycline Nausea Only    GI UPSET   Ibuprofen       Hurts pt's stomach    Triamterene    Sulfa Antibiotics Rash    Family History  Problem Relation Age of Onset   Breast cancer Maternal Aunt     Prior to Admission medications   Medication Sig Start Date End Date Taking? Authorizing Provider  acetaminophen  (TYLENOL ) 500 MG chewable tablet Chew 500 mg by mouth every 6 (six) hours as needed for pain.    [provider]  calcium carbonate (OS-CAL) 600 MG TABS tablet Take 600 mg by mouth 2 (two) times daily with a meal.    [provider]  clindamycin (CLEOCIN) 300 MG capsule Take 300 mg by mouth 3 (three) times daily. For 10 days Patient not taking: Reported on 01/25/2022    [provider]  Cyanocobalamin (B-12) 2000 MCG TABS Take 2,000 mcg by mouth daily.    [provider]  furosemide  (LASIX ) 20 MG tablet Take 1 tablet (20 mg total) by mouth daily for 5 days. 09/21/23 09/26/23  Lind Repine, MD  gabapentin  (NEURONTIN ) 100 MG capsule Take 100 mg by mouth 3 (three) times daily. One in am two in pm    [provider]  hydrochlorothiazide (MICROZIDE) 12.5 MG capsule Take 12.5 mg by mouth daily.    [provider]  loratadine (CLARITIN) 10 MG tablet Take 10 mg by mouth daily. Patient not taking: Reported on 01/25/2022    [provider]  Loratadine-Pseudoephedrine (KS ALLERCLEAR D-24HR PO) Take by mouth daily. Patient not taking: Reported on 01/25/2022    [provider]  losartan (COZAAR) 100 MG tablet Take 100 mg by mouth daily.    [provider]  meclizine (ANTIVERT) 25 MG tablet Take 25 mg by mouth 3 (three) times daily as needed for dizziness.    [provider]  metoprolol  tartrate (LOPRESSOR ) 50 MG tablet Take 75 mg by mouth 2 (two) times daily.     [provider]  mometasone (NASONEX) 50 MCG/ACT nasal spray Place 2 sprays into the nose daily as needed (allergies).    [provider]  Multiple Vitamins-Minerals (MULTIVITAMIN  WITH MINERALS) tablet Take 1 tablet by mouth daily.    [provider]  omeprazole (PRILOSEC) 40 MG capsule Take 40 mg by mouth daily.     [provider]  ondansetron  (ZOFRAN -ODT) 4 MG disintegrating tablet Take 1 tablet (4 mg total) by mouth every 6 (six) hours as needed for nausea or vomiting. 07/07/23   Ward, Clover Dao, DO    Physical Exam: Vitals:   09/27/23 0000 09/27/23 0030 09/27/23 0100 09/27/23 0130  BP:  (!) 150/63 (!) 132/59 (!) 134/48  Pulse:  82 81 75  Resp: 16 16 16 18   Temp:      TempSrc:      SpO2:  98% 97% 98%  Weight:      Height:       Physical Exam Vitals and nursing note reviewed.  Constitutional:      General: She is not in acute distress. HENT:     Head: Normocephalic and atraumatic.  Cardiovascular:  Rate and Rhythm: Normal rate and regular rhythm.     Heart sounds: Normal heart sounds.  Pulmonary:     Effort: Pulmonary effort is normal.     Breath sounds: Normal breath sounds.  Abdominal:     Palpations: Abdomen is soft.     Tenderness: There is no abdominal tenderness.  Musculoskeletal:     Right lower leg: Edema present.     Left lower leg: Edema present.     Comments: Edematous lower extremities worse on left-see pics.  Somewhat warm to the touch  Neurological:     Mental Status: Mental status is at baseline.     Labs on Admission: I have personally reviewed following labs and imaging studies  CBC: Recent Labs  Lab 09/21/23 1606 09/26/23 1817  WBC 7.3 6.9  NEUTROABS  --  4.3  HGB 13.3 12.7  HCT 39.1 38.0  MCV 88.1 89.6  PLT 280 274   Basic Metabolic Panel: Recent Labs  Lab 09/21/23 1606 09/26/23 1817  NA 128* 131*  K 5.4* 5.1  CL 92* 93*  CO2 25 28  GLUCOSE 120* 108*  BUN 17 21  CREATININE 0.62 0.77  CALCIUM 9.6 9.4   GFR: Estimated Creatinine Clearance: 44.8 mL/min (by C-G formula based on SCr of 0.77 mg/dL). Liver Function Tests: Recent Labs  Lab 09/26/23 1817  AST 26  ALT 23  ALKPHOS 73   BILITOT 0.4  PROT 7.6  ALBUMIN 3.8   No results for input(s): "LIPASE", "AMYLASE" in the last 168 hours. No results for input(s): "AMMONIA" in the last 168 hours. Coagulation Profile: No results for input(s): "INR", "PROTIME" in the last 168 hours. Cardiac Enzymes: No results for input(s): "CKTOTAL", "CKMB", "CKMBINDEX", "TROPONINI" in the last 168 hours. BNP (last 3 results) No results for input(s): "PROBNP" in the last 8760 hours. HbA1C: No results for input(s): "HGBA1C" in the last 72 hours. CBG: No results for input(s): "GLUCAP" in the last 168 hours. Lipid Profile: No results for input(s): "CHOL", "HDL", "LDLCALC", "TRIG", "CHOLHDL", "LDLDIRECT" in the last 72 hours. Thyroid Function Tests: No results for input(s): "TSH", "T4TOTAL", "FREET4", "T3FREE", "THYROIDAB" in the last 72 hours. Anemia Panel: No results for input(s): "VITAMINB12", "FOLATE", "FERRITIN", "TIBC", "IRON", "RETICCTPCT" in the last 72 hours. Urine analysis:    Component Value Date/Time   COLORURINE YELLOW (A) 07/07/2023 0604   APPEARANCEUR CLEAR (A) 07/07/2023 0604   APPEARANCEUR Clear 03/02/2013 1357   LABSPEC 1.021 07/07/2023 0604   LABSPEC 1.009 03/02/2013 1357   PHURINE 5.0 07/07/2023 0604   GLUCOSEU NEGATIVE 07/07/2023 0604   GLUCOSEU Negative 03/02/2013 1357   HGBUR NEGATIVE 07/07/2023 0604   BILIRUBINUR NEGATIVE 07/07/2023 0604   BILIRUBINUR Negative 03/02/2013 1357   KETONESUR NEGATIVE 07/07/2023 0604   PROTEINUR 30 (A) 07/07/2023 0604   NITRITE NEGATIVE 07/07/2023 0604   LEUKOCYTESUR NEGATIVE 07/07/2023 0604   LEUKOCYTESUR Negative 03/02/2013 1357    Radiological Exams on Admission: US  Venous Img Lower Unilateral Left Result Date: 09/26/2023 CLINICAL DATA:  Left lower extremity swelling EXAM: Left LOWER EXTREMITY VENOUS DOPPLER ULTRASOUND TECHNIQUE: Gray-scale sonography with compression, as well as color and duplex ultrasound, were performed to evaluate the deep venous system(s) from the  level of the common femoral vein through the popliteal and proximal calf veins. COMPARISON:  Ultrasound 09/21/2023 FINDINGS: VENOUS Normal compressibility of the common femoral, superficial femoral, and popliteal veins, as well as the visualized calf veins. Visualized portions of profunda femoral vein and great saphenous vein unremarkable. No filling defects to  suggest DVT on grayscale or color Doppler imaging. Doppler waveforms show normal direction of venous flow, normal respiratory plasticity and response to augmentation. Limited views of the contralateral common femoral vein are unremarkable. OTHER Subcutaneous edema in the left calf. Limitations: none IMPRESSION: Negative for acute DVT. Electronically Signed   By: Rozell Cornet M.D.   On: 09/26/2023 23:38   Data Reviewed for HPI: Relevant notes from primary care and specialist visits, past discharge summaries as available in EHR, including Care Everywhere. Prior diagnostic testing as pertinent to current admission diagnoses Updated medications and problem lists for reconciliation ED course, including vitals, labs, imaging, treatment and response to treatment Triage notes, nursing and pharmacy notes and ED provider's notes Notable results as noted above in HPI      Assessment and Plan: * Cellulitis of left lower extremity Continue Rocephin and vancomycin Keep leg elevated Pain control  Hyponatremia Sodium of 131 Likely hypovolemic related to taking both furosemide  and HCTZ Monitor  Hypertension Slightly elevated blood pressure Continue home Lasix  l, ARB and metoprolol .  Hold hydrochlorothiazide Addendum:  Hypotension -Postadmission, patient had a drop in blood pressure, suspect secondary to medications administered overnight -Hold home antihypertensives and resume as appropriate -NS bolus of 500 mL and reassess -Not meeting sepsis criteria    09/27/2023    5:16 AM 09/27/2023    1:30 AM 09/27/2023    1:00 AM  Vitals with BMI   Systolic 86 134 132  Diastolic 44 48 59  Pulse 58 75 81     Chronic heart failure with preserved ejection fraction (HFpEF) (HCC) Clinically euvolemic Continue home Lasix  , ARB and metoprolol     DVT prophylaxis: Lovenox  Consults: none  Advance Care Planning: fkull code  Family Communication: none  Disposition Plan: Back to previous home environment  Severity of Illness: The appropriate patient status for this patient is INPATIENT. Inpatient status is judged to be reasonable and necessary in order to provide the required intensity of service to ensure the patient's safety. The patient's presenting symptoms, physical exam findings, and initial radiographic and laboratory data in the context of their chronic comorbidities is felt to place them at high risk for further clinical deterioration. Furthermore, it is not anticipated that the patient will be medically stable for discharge from the hospital within 2 midnights of admission.   * I certify that at the point of admission it is my clinical judgment that the patient will require inpatient hospital care spanning beyond 2 midnights from the point of admission due to high intensity of service, high risk for further deterioration and high frequency of surveillance required.*  CRITICAL CARE Performed by: Lanetta Pion   Total critical care time: 65 minutes  Critical care time was exclusive of separately billable procedures and treating other patients.  Critical care was necessary to treat or prevent imminent or life-threatening deterioration.  Critical care was time spent personally by me on the following activities: development of treatment plan with patient and/or surrogate as well as nursing, discussions with consultants, evaluation of patient's response to treatment, examination of patient, obtaining history from patient or surrogate, ordering and performing treatments and interventions, ordering and review of laboratory  studies, ordering and review of radiographic studies, pulse oximetry and re-evaluation of patient's condition.  Author: Lanetta Pion, MD 09/27/2023 2:16 AM  For on call review [email or web address].

## 2023-09-27 NOTE — ED Provider Notes (Signed)
-----------------------------------------   1:53 AM on 09/27/2023 -----------------------------------------   Patient has decided she is agreeable with hospitalization.  Will consult hospitalist services for evaluation and admission.   Gabrella Stroh J, MD 09/27/23 [phone]

## 2023-09-27 NOTE — ED Provider Notes (Incomplete)
 [facility] Provider Note    Event Date/Time   First MD Initiated Contact with Patient 09/26/23 2128     (approximate)   History   Leg Swelling   HPI  Kaitlyn Nelson is a 83 y.o. female presents to the emergency department with leg swelling.  Patient states that she has recently had swelling to both of her legs and was seen in the emergency department.  States that her right leg has improved however she has ongoing swelling and pain in her left leg which brought her to the emergency department.  Denies fever or chills.  Denies nausea, vomiting, chest pain or shortness of breath.  States that she was recently evaluated in the emergency department and started on antibiotic and she has been taking it every day.  Has a follow-up appoint with her primary care physician tomorrow.  On chart review patient was evaluated on 09/21/2023 and had bilateral lower extremity DVT scan that was negative and was ultimately started on Keflex  to treat lower leg cellulitis.     Physical Exam   Triage Vital Signs: ED Triage Vitals  Encounter Vitals Group     BP 09/26/23 1807 (!) 162/67     Systolic BP Percentile --      Diastolic BP Percentile --      Pulse Rate 09/26/23 1807 72     Resp 09/26/23 1807 17     Temp 09/26/23 1807 98.1 F (36.7 C)     Temp Source 09/26/23 1807 Oral     SpO2 09/26/23 1807 94 %     Weight 09/26/23 1808 156 lb (70.8 kg)     Height 09/26/23 1808 4\' 9"  (1.448 m)     Head Circumference --      Peak Flow --      Pain Score 09/26/23 1816 6     Pain Loc --      Pain Education --      Exclude from Growth Chart --     Most recent vital signs: Vitals:   09/26/23 1807 09/26/23 2140  BP: (!) 162/67 (!) 156/77  Pulse: 72 73  Resp: 17 16  Temp: 98.1 F (36.7 C)   SpO2: 94% 99%    Physical Exam  IMPRESSION / MDM / ASSESSMENT AND PLAN / ED COURSE  I reviewed the triage vital signs and the nursing notes.  *** EKG  I, Viviano Ground,  the attending physician, personally viewed and interpreted this ECG.   Rate: Normal  Rhythm: Normal sinus  Axis: Normal  Intervals: Normal  ST&T Change: None  No tachycardic or bradycardic dysrhythmias while on cardiac telemetry.  RADIOLOGY I independently reviewed imaging, my interpretation of imaging: ***  LABS (all labs ordered are listed, but only abnormal results are displayed) Labs interpreted as -    Labs Reviewed  COMPREHENSIVE METABOLIC PANEL WITH GFR - Abnormal; Notable for the following components:      Result Value   Sodium 131 (*)    Chloride 93 (*)    Glucose, Bld 108 (*)    All other components within normal limits  CBC WITH DIFFERENTIAL/PLATELET  LACTIC ACID, PLASMA     MDM       PROCEDURES:  Critical Care performed: No  Procedures  Patient's presentation is most consistent with {EM COPA:27473}   MEDICATIONS ORDERED IN ED: Medications - No data to display  FINAL CLINICAL IMPRESSION(S) / ED DIAGNOSES   Final diagnoses:  None  Rx / DC Orders   ED Discharge Orders     None        Note:  This document was prepared using Dragon voice recognition software and may include unintentional dictation errors.

## 2023-09-27 NOTE — ED Notes (Signed)
 Pt asked that this RN call her sister and update her with the plan of care.  Spoke with pt's sister, Bobbye Burrow and answered all questions.  She will be here in the morning.

## 2023-09-27 NOTE — Assessment & Plan Note (Addendum)
 Clinically euvolemic Continue home Lasix  , ARB and metoprolol 

## 2023-09-28 DIAGNOSIS — L03116 Cellulitis of left lower limb: Secondary | ICD-10-CM | POA: Diagnosis not present

## 2023-09-28 LAB — BASIC METABOLIC PANEL WITH GFR
Anion gap: 8 (ref 5–15)
BUN: 17 mg/dL (ref 8–23)
CO2: 28 mmol/L (ref 22–32)
Calcium: 8.3 mg/dL — ABNORMAL LOW (ref 8.9–10.3)
Chloride: 98 mmol/L (ref 98–111)
Creatinine, Ser: 0.59 mg/dL (ref 0.44–1.00)
GFR, Estimated: >60 mL/min (ref >60)
Glucose, Bld: 104 mg/dL — ABNORMAL HIGH (ref 70–99)
Potassium: 4.4 mmol/L (ref 3.5–5.1)
Sodium: 134 mmol/L — ABNORMAL LOW (ref 135–145)

## 2023-09-28 MED ORDER — FLUTICASONE PROPIONATE 50 MCG/ACT NA SUSP
1.0000 | Freq: Every day | NASAL | Status: DC
  Administered 2023-09-28 – 2023-09-30 (×3): 1 via NASAL
  Filled 2023-09-28: qty 16

## 2023-09-28 MED ORDER — CALCIUM CITRATE 950 (200 CA) MG PO TABS
200.0000 mg | ORAL_TABLET | Freq: Every day | ORAL | Status: DC
  Administered 2023-09-28 – 2023-09-30 (×3): 950 mg via ORAL
  Filled 2023-09-28 (×3): qty 1

## 2023-09-28 MED ORDER — ADULT MULTIVITAMIN W/MINERALS CH
1.0000 | ORAL_TABLET | Freq: Every day | ORAL | Status: DC
  Administered 2023-09-28 – 2023-09-30 (×3): 1 via ORAL
  Filled 2023-09-28 (×3): qty 1

## 2023-09-28 MED ORDER — ALENDRONATE SODIUM 10 MG PO TABS: 70.0000 mg | ORAL_TABLET | Freq: Every day | ORAL | Status: DC

## 2023-09-28 MED ORDER — LORATADINE 10 MG PO TABS
10.0000 mg | ORAL_TABLET | Freq: Every day | ORAL | Status: DC | PRN
  Administered 2023-09-28: 10 mg via ORAL
  Filled 2023-09-28: qty 1

## 2023-09-28 NOTE — Progress Notes (Signed)
 Patient ordered on CPAP at night. Patient wears CPAP at home. Patient has been here for a couple of nights now without home machine and states "she has been ok without it and would rather family bring her machine in tomorrow."

## 2023-09-28 NOTE — Progress Notes (Signed)
 PHARMACIST - PHYSICIAN COMMUNICATION  CONCERNING: P&T Medication Policy Regarding Oral Bisphosphonates  RECOMMENDATION: Your order for alendronate (Fosamax), ibandronate (Boniva), or risedronate (Actonel) has been discontinued at this time.  If the patient's post-hospital medical condition warrants safe use of this class of drugs, please resume the pre-hospital regimen upon discharge.  DESCRIPTION:  Alendronate (Fosamax), ibandronate (Boniva), and risedronate (Actonel) can cause severe esophageal erosions in patients who are unable to remain upright at least 30 minutes after taking this medication.   Since brief interruptions in therapy are thought to have minimal impact on bone mineral density, the Pharmacy & Therapeutics Committee has established that bisphosphonate orders should be routinely discontinued during hospitalization.   To override this safety policy and permit administration of Boniva, Fosamax, or Actonel in the hospital, prescribers must write "DO NOT HOLD" in the comments section when placing the order for this class of medications.  Malone Sear, PharmD, BCPS Clinical Pharmacist 09/28/2023 11:18 AM

## 2023-09-28 NOTE — Progress Notes (Signed)
 PROGRESS NOTE    Kaitlyn Nelson  JXB:[id number] DOB: 1940/11/26 DOA: 09/26/2023 PCP: Antonio Baumgarten, MD    Assessment & Plan:   Principal Problem:   Cellulitis of left lower extremity Active Problems:   Chronic heart failure with preserved ejection fraction (HFpEF) (HCC)   Hypertension   Hyponatremia  Assessment and Plan: Left lower extremity cellulitis: nonpurulent. D/c vancomycin.  Continue IV ceftriaxone.    Hyponatremia: almost WNL   Hyperkalemia: resolved     Hypotension: resolved.   HTN: continue on metoprolol . Holding amlodipine, valsartan, HCTZ   Chronic diastolic CHF: continue on lasix . Monitor I/Os   OSA: CPAP at bedtime ordered      DVT prophylaxis: lovenox  Code Status: Full  Family Communication: discussed pt's care w/ pt's family at bedside and answered their questions  Disposition Plan: likely d/c back home  Level of care: Med-Surg  Status is: Inpatient Remains inpatient appropriate because: severity of illness, requiring IV abxs    Consultants:    Procedures:   Antimicrobials: rocephin    Subjective: Pt c/o swelling of b/l LE  Objective: Vitals:   09/27/23 1152 09/27/23 1500 09/27/23 2007 09/28/23 0400  BP: 116/68 131/72 (!) 120/53 (!) 120/46  Pulse: 72 76 79 79  Resp: 18 18 18 16   Temp: 98 F (36.7 C) 98 F (36.7 C) 98.6 F (37 C) 97.8 F (36.6 C)  TempSrc: Oral Oral    SpO2: 97% 98% 96% 94%  Weight:      Height:        Intake/Output Summary (Last 24 hours) at 09/28/2023 0821 Last data filed at 09/28/2023 0352 Gross per 24 hour  Intake 580 ml  Output 600 ml  Net -20 ml   Filed Weights   09/26/23 1808  Weight: 70.8 kg    Examination:  General exam: Appears calm and comfortable  Respiratory system: Clear to auscultation. Respiratory effort normal. Cardiovascular system: S1 & S2+. No rubs, gallops or clicks Gastrointestinal system: Abdomen is nondistended, soft and nontender. Normal bowel sounds  heard. Central nervous system: Alert and oriented. Moves all extremities  Psychiatry: Judgement and insight appear normal. Mood & affect appropriate.     Data Reviewed: I have personally reviewed following labs and imaging studies  CBC: Recent Labs  Lab 09/21/23 1606 09/26/23 1817  WBC 7.3 6.9  NEUTROABS  --  4.3  HGB 13.3 12.7  HCT 39.1 38.0  MCV 88.1 89.6  PLT 280 274   Basic Metabolic Panel: Recent Labs  Lab 09/21/23 1606 09/26/23 1817 09/28/23 0354  NA 128* 131* 134*  K 5.4* 5.1 4.4  CL 92* 93* 98  CO2 25 28 28   GLUCOSE 120* 108* 104*  BUN 17 21 17   CREATININE 0.62 0.77 0.59  CALCIUM 9.6 9.4 8.3*   GFR: Estimated Creatinine Clearance: 44.8 mL/min (by C-G formula based on SCr of 0.59 mg/dL). Liver Function Tests: Recent Labs  Lab 09/26/23 1817  AST 26  ALT 23  ALKPHOS 73  BILITOT 0.4  PROT 7.6  ALBUMIN 3.8   No results for input(s): "LIPASE", "AMYLASE" in the last 168 hours. No results for input(s): "AMMONIA" in the last 168 hours. Coagulation Profile: No results for input(s): "INR", "PROTIME" in the last 168 hours. Cardiac Enzymes: No results for input(s): "CKTOTAL", "CKMB", "CKMBINDEX", "TROPONINI" in the last 168 hours. BNP (last 3 results) No results for input(s): "PROBNP" in the last 8760 hours. HbA1C: No results for input(s): "HGBA1C" in the last 72 hours. CBG: No results for  input(s): "GLUCAP" in the last 168 hours. Lipid Profile: No results for input(s): "CHOL", "HDL", "LDLCALC", "TRIG", "CHOLHDL", "LDLDIRECT" in the last 72 hours. Thyroid Function Tests: No results for input(s): "TSH", "T4TOTAL", "FREET4", "T3FREE", "THYROIDAB" in the last 72 hours. Anemia Panel: No results for input(s): "VITAMINB12", "FOLATE", "FERRITIN", "TIBC", "IRON", "RETICCTPCT" in the last 72 hours. Sepsis Labs: Recent Labs  Lab 09/26/23 1817  LATICACIDVEN 1.6    No results found for this or any previous visit (from the past 240 hours).       Radiology  Studies: US  Venous Img Lower Unilateral Left Result Date: 09/26/2023 CLINICAL DATA:  Left lower extremity swelling EXAM: Left LOWER EXTREMITY VENOUS DOPPLER ULTRASOUND TECHNIQUE: Gray-scale sonography with compression, as well as color and duplex ultrasound, were performed to evaluate the deep venous system(s) from the level of the common femoral vein through the popliteal and proximal calf veins. COMPARISON:  Ultrasound 09/21/2023 FINDINGS: VENOUS Normal compressibility of the common femoral, superficial femoral, and popliteal veins, as well as the visualized calf veins. Visualized portions of profunda femoral vein and great saphenous vein unremarkable. No filling defects to suggest DVT on grayscale or color Doppler imaging. Doppler waveforms show normal direction of venous flow, normal respiratory plasticity and response to augmentation. Limited views of the contralateral common femoral vein are unremarkable. OTHER Subcutaneous edema in the left calf. Limitations: none IMPRESSION: Negative for acute DVT. Electronically Signed   By: Rozell Cornet M.D.   On: 09/26/2023 23:38        Scheduled Meds:  enoxaparin (LOVENOX) injection  40 mg Subcutaneous Q24H   furosemide   20 mg Oral Daily   gabapentin   100 mg Oral QHS   metoprolol  tartrate  100 mg Oral BID   Continuous Infusions:  cefTRIAXone (ROCEPHIN)  IV Stopped (09/27/23 2204)     LOS: 1 day       Alphonsus Jeans, MD [facility] Pager [phone]  If 7PM-7AM, please contact night-coverage [email or web address] 09/28/2023, 8:21 AM

## 2023-09-28 NOTE — Evaluation (Signed)
 Physical Therapy Evaluation Patient Details Name: Kaitlyn Nelson MRN: [id number] DOB: Oct 28, 1940 Today's Date: 09/28/2023  History of Present Illness  Kaitlyn Nelson is a 83 y.o. female with medical history significant for HTN, OA, HFpEF (EF 55%), chronic exertional dyspnea with ischemic stress test November 2024, being admitted for left lower extremity cellulitis failing outpatient treatment with Keflex  prescribed on 4/23.  Patient had been having bilateral lower extremity swelling but now she has pain and worsening swelling in the right leg compared to the left.   Clinical Impression  Patient received in recliner, she is agreeable to PT assessment.Sister present in room. Patient reports pain in B LEs, L >R. She is able to stand with supervision and ambulated with RW and SPC in room x 90 feet with cga. Patient without lob, just some discomfort due to swelling. She will continue to benefit from skilled PT while here to improve mobility and independence.         If plan is discharge home, recommend the following: A little help with walking and/or transfers;A little help with bathing/dressing/bathroom;Assist for transportation;Help with stairs or ramp for entrance   Can travel by private vehicle    yes    Equipment Recommendations Rolling walker (2 wheels) (youth height)  Recommendations for Other Services       Functional Status Assessment Patient has had a recent decline in their functional status and demonstrates the ability to make significant improvements in function in a reasonable and predictable amount of time.     Precautions / Restrictions Precautions Precaution/Restrictions Comments: mod fall Restrictions Weight Bearing Restrictions Per Provider Order: No      Mobility  Bed Mobility               General bed mobility comments: NT patient in recliner    Transfers Overall transfer level: Modified independent Equipment used: Rolling walker (2  wheels)                    Ambulation/Gait Ambulation/Gait assistance: Contact guard assist, Supervision Gait Distance (Feet): 90 Feet Assistive device: Rolling walker (2 wheels), Straight cane Gait Pattern/deviations: Step-through pattern, Decreased step length - right, Decreased step length - left, Decreased stride length Gait velocity: decr     General Gait Details: patient initally ambulated with RW then switched to [facility]. Patient requires sba to cga only. No lob. Limited by LE pain/heaviness  Stairs            Wheelchair Mobility     Tilt Bed    Modified Rankin (Stroke Patients Only)       Balance Overall balance assessment: Modified Independent, History of Falls                                           Pertinent Vitals/Pain Pain Assessment Pain Assessment: Faces Faces Pain Scale: Hurts even more Pain Location: L leg pain Pain Descriptors / Indicators: Discomfort, Grimacing, Sore Pain Intervention(s): Monitored during session, Repositioned    Home Living Family/patient expects to be discharged to:: Private residence Living Arrangements: Alone Available Help at Discharge: Family;Available PRN/intermittently;Friend(s) Type of Home: House Home Access: Stairs to enter Entrance Stairs-Rails: Right;Left;Can reach both Entrance Stairs-Number of Steps: 2   Home Layout: One level Home Equipment: Agricultural consultant (2 wheels);Cane - single point      Prior Function Prior Level of Function : Independent/Modified Independent;Working/employed;Driving;History of  Falls (last six months)             Mobility Comments: patient uses SPC occasionally. Otherwise ambulates without AD ADLs Comments: independent     Extremity/Trunk Assessment   Upper Extremity Assessment Upper Extremity Assessment: Defer to OT evaluation    Lower Extremity Assessment Lower Extremity Assessment: Generalized weakness    Cervical / Trunk  Assessment Cervical / Trunk Assessment: Normal  Communication   Communication Communication: No apparent difficulties Factors Affecting Communication: Reduced clarity of speech;Difficulty expressing self    Cognition Arousal: Alert Behavior During Therapy: WFL for tasks assessed/performed   PT - Cognitive impairments: No apparent impairments                         Following commands: Intact Following commands impaired: Only follows one step commands consistently     Cueing Cueing Techniques: Verbal cues     General Comments      Exercises     Assessment/Plan    PT Assessment Patient needs continued PT services  PT Problem List Decreased strength;Decreased activity tolerance;Decreased mobility;Pain       PT Treatment Interventions DME instruction;Gait training;Stair training;Functional mobility training;Therapeutic activities;Therapeutic exercise;Patient/family education    PT Goals (Current goals can be found in the Care Plan section)  Acute Rehab PT Goals Patient Stated Goal: return home, back to independence PT Goal Formulation: With patient/family Time For Goal Achievement: 10/07/23 Potential to Achieve Goals: Good    Frequency Min 2X/week     Co-evaluation               AM-PAC PT "6 Clicks" Mobility  Outcome Measure Help needed turning from your back to your side while in a flat bed without using bedrails?: None Help needed moving from lying on your back to sitting on the side of a flat bed without using bedrails?: None Help needed moving to and from a bed to a chair (including a wheelchair)?: A Little Help needed standing up from a chair using your arms (e.g., wheelchair or bedside chair)?: None Help needed to walk in hospital room?: A Little Help needed climbing 3-5 steps with a railing? : A Little 6 Click Score: 21    End of Session   Activity Tolerance: Patient tolerated treatment well Patient left: in chair;with call bell/phone  within reach;with family/visitor present Nurse Communication: Mobility status PT Visit Diagnosis: Difficulty in walking, not elsewhere classified (R26.2);Pain Pain - Right/Left: Right Pain - part of body: Leg    Time: 1235-1256 PT Time Calculation (min) (ACUTE ONLY): 21 min   Charges:   PT Evaluation $PT Eval Low Complexity: 1 Low PT Treatments $Gait Training: 8-22 mins PT General Charges $$ ACUTE PT VISIT: 1 Visit         Elif Yonts, PT, GCS 09/28/23,1:34 PM

## 2023-09-28 NOTE — Evaluation (Signed)
 Occupational Therapy Evaluation Patient Details Name: Kaitlyn Nelson MRN: [id number] DOB: [birth year] Today's Date: 09/28/2023   History of Present Illness   Kaitlyn Nelson is a 83 y.o. female with medical history significant for HTN, OA, HFpEF (EF 55%), chronic exertional dyspnea with ischemic stress test November 2024, being admitted for left lower extremity cellulitis failing outpatient treatment with Keflex  prescribed on 4/23.  Patient had been having bilateral lower extremity swelling but now she has pain and worsening swelling in the right leg compared to the left.     Clinical Impressions Upon entering the room, pt supine in bed and agreeable to OT intervention. Pt is sitting in recliner chair with sister in room and is in good spirits. Pt reports that she feels "75% like herself". Pt lives at home alone and is Ind in all aspects of care. 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 She drives and does endorse use of SPC recently. Pt requesting to wash up during session. OT provided set up for pt to obtain needed items and pt performs basin bath with intermittent supervision. No physical assistance provided. Pt remained seated in recliner chair at end of session. Call bell and all needed items within reach upon exiting the room. Pt does not need skilled acute OT intervention at this time. OT to complete orders.      If plan is discharge home, recommend the following:   Assistance with cooking/housework;Help with stairs or ramp for entrance;Assist for transportation     Functional Status Assessment   Patient has not had a recent decline in their functional status     Equipment Recommendations   None recommended by OT      Precautions/Restrictions   Precautions Precautions: Fall     Mobility Bed Mobility               General bed mobility comments: NT patient in recliner    Transfers Overall transfer level: Modified  independent Equipment used: Rolling walker (2 wheels)                      Balance Overall balance assessment: Modified Independent, History of Falls                                         ADL either performed or assessed with clinical judgement   ADL Overall ADL's : Needs assistance/impaired             Lower Body Bathing: Supervison/ safety;Sitting/lateral leans   Upper Body Dressing : Supervision/safety   Lower Body Dressing: Supervision/safety                       Vision Patient Visual Report: No change from baseline              Pertinent Vitals/Pain Pain Assessment Pain Assessment: Faces Faces Pain Scale: Hurts a little bit Pain Location: L leg pain Pain Descriptors / Indicators: Discomfort, Grimacing, Sore Pain Intervention(s): Monitored during session, Limited activity within patient's tolerance, Repositioned     Extremity/Trunk Assessment Upper Extremity Assessment Upper Extremity Assessment: Defer to OT evaluation   Lower Extremity Assessment Lower Extremity Assessment: Generalized weakness   Cervical / Trunk Assessment Cervical / Trunk Assessment: Normal   Communication Communication Communication: No apparent difficulties Factors Affecting Communication: Reduced clarity of speech;Difficulty expressing self   Cognition Arousal: Alert  Behavior During Therapy: WFL for tasks assessed/performed Cognition: No apparent impairments                               Following commands: Intact Following commands impaired: Only follows one step commands consistently     Cueing  General Comments   Cueing Techniques: Verbal cues              Home Living Family/patient expects to be discharged to:: Private residence Living Arrangements: Alone Available Help at Discharge: Family;Available PRN/intermittently;Friend(s) Type of Home: House Home Access: Stairs to enter [employer] of Steps:  2 Entrance Stairs-Rails: Right;Left;Can reach both Home Layout: One level     Bathroom Shower/Tub: Tub/shower unit         Home Equipment: Agricultural consultant (2 wheels);Cane - single point          Prior Functioning/Environment Prior Level of Function : Independent/Modified Independent;Working/employed;Driving;History of Falls (last six months)             Mobility Comments: patient uses SPC occasionally. Otherwise ambulates without AD ADLs Comments: independent            OT Goals(Current goals can be found in the care plan section)   Acute Rehab OT Goals Patient Stated Goal: to return home OT Goal Formulation: With patient/family Time For Goal Achievement: 09/28/23 Potential to Achieve Goals: Fair   OT Frequency:       AM-PAC OT "6 Clicks" Daily Activity     Outcome Measure Help from another person eating meals?: None Help from another person taking care of personal grooming?: None Help from another person toileting, which includes using toliet, bedpan, or urinal?: None Help from another person bathing (including washing, rinsing, drying)?: None Help from another person to put on and taking off regular upper body clothing?: None Help from another person to put on and taking off regular lower body clothing?: None 6 Click Score: 24   End of Session Nurse Communication: Mobility status  Activity Tolerance: Patient tolerated treatment well Patient left: in bed;with call bell/phone within reach;with bed alarm set                   Time: 2130-8657 OT Time Calculation (min): 28 min Charges:  OT General Charges $OT Visit: 1 Visit OT Evaluation $OT Eval Low Complexity: 1 Low OT Treatments $Self Care/Home Management : 8-22 mins  George Kinder, MS, OTR/L , CBIS ascom [phone]  09/28/23, 2:42 PM

## 2023-09-28 NOTE — Plan of Care (Signed)
  Problem: Clinical Measurements: Goal: Diagnostic test results will improve Outcome: Progressing   Problem: Activity: Goal: Risk for activity intolerance will decrease Outcome: Progressing   Problem: Coping: Goal: Level of anxiety will decrease Outcome: Progressing   Problem: Pain Managment: Goal: General experience of comfort will improve and/or be controlled Outcome: Progressing   Problem: Safety: Goal: Ability to remain free from injury will improve Outcome: Progressing

## 2023-09-29 DIAGNOSIS — L03116 Cellulitis of left lower limb: Secondary | ICD-10-CM | POA: Diagnosis not present

## 2023-09-29 LAB — BASIC METABOLIC PANEL WITH GFR
Anion gap: 3 — ABNORMAL LOW (ref 5–15)
BUN: 19 mg/dL (ref 8–23)
CO2: 29 mmol/L (ref 22–32)
Calcium: 8.2 mg/dL — ABNORMAL LOW (ref 8.9–10.3)
Chloride: 101 mmol/L (ref 98–111)
Creatinine, Ser: 0.59 mg/dL (ref 0.44–1.00)
GFR, Estimated: >60 mL/min (ref >60)
Glucose, Bld: 111 mg/dL — ABNORMAL HIGH (ref 70–99)
Potassium: 4.2 mmol/L (ref 3.5–5.1)
Sodium: 133 mmol/L — ABNORMAL LOW (ref 135–145)

## 2023-09-29 MED ORDER — AMLODIPINE BESYLATE 5 MG PO TABS
5.0000 mg | ORAL_TABLET | Freq: Every day | ORAL | Status: DC
  Administered 2023-09-29 – 2023-09-30 (×2): 5 mg via ORAL
  Filled 2023-09-29 (×2): qty 1

## 2023-09-29 MED ORDER — ALENDRONATE SODIUM 70 MG PO TABS
70.0000 mg | ORAL_TABLET | ORAL | Status: DC
  Filled 2023-09-29 (×2): qty 1

## 2023-09-29 NOTE — Progress Notes (Signed)
 PROGRESS NOTE    Kaitlyn Nelson  ZOX:[id number] DOB: [birth year] DOA: 09/26/2023 PCP: Antonio Baumgarten, MD    Assessment & Plan:   Principal Problem:   Cellulitis of left lower extremity Active Problems:   Chronic heart failure with preserved ejection fraction (HFpEF) (HCC)   Hypertension   Hyponatremia  Assessment and Plan: Left lower extremity cellulitis: nonpurulent. Slowly improving. Continue on IV rocephin      Hyponatremia: almost WNL   Hyperkalemia: resolved     Hypotension: resolved.   HTN: continue on metoprolol  & restart home dose of amlodipine . Holding valsartan, HCTZ   Chronic diastolic CHF: continue on lasix . Monitor I/Os  OSA: CPAP at bedtime ordered but pt wants to use her own CPAP machine      DVT prophylaxis: lovenox   Code Status: Full  Family Communication: discussed pt's care w/ pt's family at bedside and answered their questions  Disposition Plan: likely d/c back home  Level of care: Med-Surg  Status is: Inpatient Remains inpatient appropriate because: severity of illness, requiring IV abxs    Consultants:    Procedures:   Antimicrobials: rocephin     Subjective: Pt swelling on b/l LE  Objective: Vitals:   09/28/23 1943 09/28/23 2217 09/29/23 0415 09/29/23 0752  BP: (!) 154/62  (!) 143/65 (!) 144/57  Pulse: 74 74 65 63  Resp: 17  16 16   Temp:    97.7 F (36.5 C)  TempSrc:      SpO2: 99%  97% 96%  Weight:      Height:        Intake/Output Summary (Last 24 hours) at 09/29/2023 0841 Last data filed at 09/28/2023 1900 Gross per 24 hour  Intake 780 ml  Output --  Net 780 ml   Filed Weights   09/26/23 1808  Weight: 70.8 kg    Examination:  General exam: Appears comfortable  Respiratory system: clear breath sounds b/l  Cardiovascular system: S1/S2+. No rubs or clicks  Gastrointestinal system: Abd is soft, NT, obese & hypoactive bowel sounds  Central nervous system: alert & oriented. Moves all extremities   Psychiatry: Judgement and insight appears at baseline. Flat mood and affect     Data Reviewed: I have personally reviewed following labs and imaging studies  CBC: Recent Labs  Lab 09/26/23 1817  WBC 6.9  NEUTROABS 4.3  HGB 12.7  HCT 38.0  MCV 89.6  PLT 274   Basic Metabolic Panel: Recent Labs  Lab 09/26/23 1817 09/28/23 0354 09/29/23 0546  NA 131* 134* 133*  K 5.1 4.4 4.2  CL 93* 98 101  CO2 28 28 29   GLUCOSE 108* 104* 111*  BUN 21 17 19   CREATININE 0.77 0.59 0.59  CALCIUM  9.4 8.3* 8.2*   GFR: Estimated Creatinine Clearance: 44.8 mL/min (by C-G formula based on SCr of 0.59 mg/dL). Liver Function Tests: Recent Labs  Lab 09/26/23 1817  AST 26  ALT 23  ALKPHOS 73  BILITOT 0.4  PROT 7.6  ALBUMIN 3.8   No results for input(s): "LIPASE", "AMYLASE" in the last 168 hours. No results for input(s): "AMMONIA" in the last 168 hours. Coagulation Profile: No results for input(s): "INR", "PROTIME" in the last 168 hours. Cardiac Enzymes: No results for input(s): "CKTOTAL", "CKMB", "CKMBINDEX", "TROPONINI" in the last 168 hours. BNP (last 3 results) No results for input(s): "PROBNP" in the last 8760 hours. HbA1C: No results for input(s): "HGBA1C" in the last 72 hours. CBG: No results for input(s): "GLUCAP" in the last 168 hours. Lipid Profile: No  results for input(s): "CHOL", "HDL", "LDLCALC", "TRIG", "CHOLHDL", "LDLDIRECT" in the last 72 hours. Thyroid Function Tests: No results for input(s): "TSH", "T4TOTAL", "FREET4", "T3FREE", "THYROIDAB" in the last 72 hours. Anemia Panel: No results for input(s): "VITAMINB12", "FOLATE", "FERRITIN", "TIBC", "IRON", "RETICCTPCT" in the last 72 hours. Sepsis Labs: Recent Labs  Lab 09/26/23 1817  LATICACIDVEN 1.6    No results found for this or any previous visit (from the past 240 hours).       Radiology Studies: No results found.       Scheduled Meds:  [START ON 09/30/2023] alendronate   70 mg Oral Weekly    calcium  citrate  200 mg of elemental calcium  Oral Daily   enoxaparin  (LOVENOX ) injection  40 mg Subcutaneous Q24H   fluticasone   1 spray Each Nare Daily   furosemide   20 mg Oral Daily   gabapentin   100 mg Oral QHS   metoprolol  tartrate  100 mg Oral BID   multivitamin with minerals  1 tablet Oral Daily   Continuous Infusions:  cefTRIAXone  (ROCEPHIN )  IV Stopped (09/28/23 2247)     LOS: 2 days       Alphonsus Jeans, MD [facility] Pager [phone]  If 7PM-7AM, please contact night-coverage [email or web address] 09/29/2023, 8:41 AM

## 2023-09-29 NOTE — Progress Notes (Signed)
 Physical Therapy Treatment Patient Details Name: Kaitlyn Nelson MRN: [id number] DOB: [birth year] Today's Date: 09/29/2023   History of Present Illness Kaitlyn Nelson is a 83 y.o. female with medical history significant for HTN, OA, HFpEF (EF 55%), chronic exertional dyspnea with ischemic stress test November 2024, being admitted for left lower extremity cellulitis failing outpatient treatment with Keflex  prescribed on 4/23.  Patient had been having bilateral lower extremity swelling but now she has pain and worsening swelling in the right leg compared to the left.    PT Comments  Patient received up in recliner. She is agreeable to PT session. Patient stands with cga from recliner. She ambulated 300 feet with SPC and cga. No lob, no difficulty noted. She appears to be close to baseline. ( Usually does not use AD with mobility and works 5 days a week). She will continue to benefit from skilled PT to improve strength and independence.     If plan is discharge home, recommend the following: A little help with walking and/or transfers;A little help with bathing/dressing/bathroom;Assist for transportation;Help with stairs or ramp for entrance   Can travel by private vehicle      yes  Equipment Recommendations  Rolling walker (2 wheels) (youth RW)    Recommendations for Other Services       Precautions / Restrictions Precautions Precautions: Fall Precaution/Restrictions Comments: mod fall Restrictions Weight Bearing Restrictions Per Provider Order: No     Mobility  Bed Mobility               General bed mobility comments: NT patient in recliner    Transfers Overall transfer level: Modified independent Equipment used: Straight cane                    Ambulation/Gait Ambulation/Gait assistance: Supervision, Contact guard assist Gait Distance (Feet): 300 Feet Assistive device: Straight cane Gait Pattern/deviations: Step-through pattern, Decreased step  length - right, Decreased step length - left Gait velocity: WNL     General Gait Details: Patient improved to ambulation 2 laps around nursing station with [facility]   Stairs             Wheelchair Mobility     Tilt Bed    Modified Rankin (Stroke Patients Only)       Balance Overall balance assessment: Modified Independent                                          Communication Communication Communication: No apparent difficulties  Cognition Arousal: Alert Behavior During Therapy: WFL for tasks assessed/performed   PT - Cognitive impairments: No apparent impairments                         Following commands: Intact Following commands impaired: Only follows one step commands consistently    Cueing Cueing Techniques: Verbal cues  Exercises      General Comments        Pertinent Vitals/Pain Pain Assessment Faces Pain Scale: Hurts a little bit Pain Location: L leg pain Pain Intervention(s): Monitored during session, Repositioned    Home Living                          Prior Function            PT Goals (current goals can now be  found in the care plan section) Acute Rehab PT Goals Patient Stated Goal: return home, back to independence PT Goal Formulation: With patient/family Time For Goal Achievement: 10/07/23 Potential to Achieve Goals: Good Progress towards PT goals: Progressing toward goals    Frequency    Min 2X/week      PT Plan      Co-evaluation              AM-PAC PT "6 Clicks" Mobility   Outcome Measure  Help needed turning from your back to your side while in a flat bed without using bedrails?: None Help needed moving from lying on your back to sitting on the side of a flat bed without using bedrails?: None Help needed moving to and from a bed to a chair (including a wheelchair)?: A Little Help needed standing up from a chair using your arms (e.g., wheelchair or bedside chair)?:  None Help needed to walk in hospital room?: A Little Help needed climbing 3-5 steps with a railing? : A Little 6 Click Score: 21    End of Session Equipment Utilized During Treatment: Gait belt Activity Tolerance: Patient tolerated treatment well Patient left: in chair;with call bell/phone within reach;with family/visitor present Nurse Communication: Mobility status PT Visit Diagnosis: Pain;Difficulty in walking, not elsewhere classified (R26.2) Pain - Right/Left: Left Pain - part of body: Leg     Time: 1610-9604 PT Time Calculation (min) (ACUTE ONLY): 29 min  Charges:    $Gait Training: 8-22 mins PT General Charges $$ ACUTE PT VISIT: 1 Visit                     Mackinzee Roszak, PT, GCS 09/29/23,11:24 AM

## 2023-09-29 NOTE — Plan of Care (Signed)

## 2023-09-30 DIAGNOSIS — L03116 Cellulitis of left lower limb: Secondary | ICD-10-CM | POA: Diagnosis not present

## 2023-09-30 LAB — BASIC METABOLIC PANEL WITH GFR
Anion gap: 7 (ref 5–15)
BUN: 21 mg/dL (ref 8–23)
CO2: 26 mmol/L (ref 22–32)
Calcium: 8.6 mg/dL — ABNORMAL LOW (ref 8.9–10.3)
Chloride: 100 mmol/L (ref 98–111)
Creatinine, Ser: 0.47 mg/dL (ref 0.44–1.00)
GFR, Estimated: >60 mL/min (ref >60)
Glucose, Bld: 100 mg/dL — ABNORMAL HIGH (ref 70–99)
Potassium: 4.1 mmol/L (ref 3.5–5.1)
Sodium: 133 mmol/L — ABNORMAL LOW (ref 135–145)

## 2023-09-30 MED ORDER — CEFIXIME 400 MG PO CAPS: 400.0000 mg | ORAL_CAPSULE | Freq: Every day | ORAL | 0 refills | Status: AC

## 2023-09-30 NOTE — Plan of Care (Signed)
  Problem: Education: Goal: Knowledge of General Education information will improve Description: Including pain rating scale, medication(s)/side effects and non-pharmacologic comfort measures Outcome: Completed/Met   Problem: Health Behavior/Discharge Planning: Goal: Ability to manage health-related needs will improve Outcome: Completed/Met   Problem: Clinical Measurements: Goal: Ability to maintain clinical measurements within normal limits will improve Outcome: Completed/Met Goal: Will remain free from infection Outcome: Completed/Met Goal: Diagnostic test results will improve Outcome: Completed/Met Goal: Respiratory complications will improve Outcome: Completed/Met Goal: Cardiovascular complication will be avoided Outcome: Completed/Met   Problem: Activity: Goal: Risk for activity intolerance will decrease Outcome: Completed/Met   Problem: Nutrition: Goal: Adequate nutrition will be maintained Outcome: Completed/Met   Problem: Coping: Goal: Level of anxiety will decrease Outcome: Completed/Met   Problem: Elimination: Goal: Will not experience complications related to bowel motility Outcome: Completed/Met Goal: Will not experience complications related to urinary retention Outcome: Completed/Met   Problem: Pain Managment: Goal: General experience of comfort will improve and/or be controlled Outcome: Completed/Met   Problem: Safety: Goal: Ability to remain free from injury will improve Outcome: Completed/Met   Problem: Skin Integrity: Goal: Risk for impaired skin integrity will decrease Outcome: Completed/Met   Problem: Clinical Measurements: Goal: Ability to avoid or minimize complications of infection will improve Outcome: Completed/Met   Problem: Skin Integrity: Goal: Skin integrity will improve Outcome: Completed/Met

## 2023-09-30 NOTE — Progress Notes (Signed)
 Sent to pharmacy for Fosamax .

## 2023-09-30 NOTE — Discharge Summary (Signed)
 Physician Discharge Summary  Kaitlyn Nelson [facility] UJW:[id number] DOB: 28-Sep-1940 DOA: 09/26/2023  PCP: Kaitlyn Baumgarten, MD  Admit date: 09/26/2023 Discharge date: 09/30/2023  Admitted From: home  Disposition:  home   Recommendations for Outpatient Follow-up:  Follow up with PCP in 1-2 weeks   Home Health: no  Equipment/Devices:  Discharge Condition: stable  CODE STATUS: full  Diet recommendation: Heart Healthy  Brief/Interim Summary: HPI was taken from Dr. Vallarie Nelson: Kaitlyn Nelson is a 83 y.o. female with medical history significant for HTN, OA, HFpEF (EF 55%), chronic exertional dyspnea with ischemic stress test November 2024, being admitted for left lower extremity cellulitis failing outpatient treatment with Keflex  prescribed on 4/23.  Patient had been having bilateral lower extremity swelling but now she has pain and worsening swelling in the right leg compared to the left.  She denies fever or chills.  Has baseline dyspnea, no worse.  She was seen in the ED on 4/23 when she had bilateral lower extremity venous ultrasound that was negative for DVT and Keflex  was ordered at the time. ED course and data review: BP 162/67 with otherwise normal vitals. Labs including CBC, lactic acid and CMP unremarkable except for slightly low sodium of 131   Repeat lower extremity venous Doppler negative for DVT   Patient started on Rocephin  and vancomycin    Hospitalist consulted for admission.   Discharge Diagnoses:  Principal Problem:   Cellulitis of left lower extremity Active Problems:   Chronic heart failure with preserved ejection fraction (HFpEF) (HCC)   Hypertension   Hyponatremia  Left lower extremity cellulitis: nonpurulent. Slowly improving. Continue on IV rocephin  while inpatient and d/c home on cefixime to complete the course    Hyponatremia: almost WNL    Hyperkalemia: resolved     Hypotension: resolved.    HTN: continue on metoprolol , amlodipine , valsartan,  HCTZ   Chronic diastolic CHF: continue on lasix . Monitor I/Os   OSA: CPAP at bedtime ordered but pt wants to use her own CPAP machine  Obesity: BMI 33.7. Complicates overall care & prognosis   Discharge Instructions  Discharge Instructions     Diet - low sodium heart healthy   Complete by: As directed    Discharge instructions   Complete by: As directed    F/u w/ PCP in 1-2 weeks.   Increase activity slowly   Complete by: As directed       Allergies as of 09/30/2023       Reactions   Aspirin    Stomach pain   Augmentin [amoxicillin-pot Clavulanate] Diarrhea   Compazine [prochlorperazine Edisylate]    Pt states she does not remember    Doxycycline Nausea Only   GI UPSET   Ibuprofen     Hurts pt's stomach    Triamterene    Sulfa Antibiotics Rash        Medication List     STOP taking these medications    cephALEXin  500 MG capsule Commonly known as: KEFLEX        TAKE these medications    acetaminophen  500 MG chewable tablet Commonly known as: TYLENOL  Chew 500 mg by mouth every 6 (six) hours as needed for pain.   alendronate  70 MG tablet Commonly known as: FOSAMAX  Take 70 mg by mouth.  Take 1 tablet (70 mg total) by mouth every 7 (seven) days Take on an empty stomach with a full glass of water. Avoid mineral or well water. Do not eat or take other medications for at least 30 minutes after dose.  Sit or stand for at least 30 minutes after dose.    Instructions: Take 1 tablet (70 mg total) by mouth every 7 (seven) days Take on an empty stomach with a full glass of water. Avoid mineral or well water. Do not eat or take other medications for at least 30 minutes after dose. Sit or stand for at least 30 minutes after dose.   amLODipine  5 MG tablet Commonly known as: NORVASC  Take 5 mg by mouth daily.   B-12 2000 MCG Tabs Take 2,000 mcg by mouth daily.   CALCIUM  CITRATE PO Take 1 capsule by mouth 2 (two) times daily.   cefixime 400 MG Caps capsule Commonly  known as: SUPRAX Take 1 capsule (400 mg total) by mouth daily for 5 days.   fluticasone  50 MCG/ACT nasal spray Commonly known as: FLONASE  Place 2 sprays into both nostrils daily as needed for allergies or rhinitis.   furosemide  20 MG tablet Commonly known as: Lasix  Take 1 tablet (20 mg total) by mouth daily for 5 days.   gabapentin  100 MG capsule Commonly known as: NEURONTIN  Take 100 mg by mouth at bedtime.   hydrochlorothiazide 12.5 MG capsule Commonly known as: MICROZIDE Take 12.5 mg by mouth daily.   loratadine  10 MG tablet Commonly known as: CLARITIN  Take 10 mg by mouth daily.   meclizine 25 MG tablet Commonly known as: ANTIVERT Take 25 mg by mouth 3 (three) times daily as needed for dizziness.   metoprolol  tartrate 100 MG tablet Commonly known as: LOPRESSOR  Take 100 mg by mouth 2 (two) times daily.   multivitamin with minerals tablet Take 1 tablet by mouth daily.   omeprazole 40 MG capsule Commonly known as: PRILOSEC Take 40 mg by mouth 2 (two) times daily. Before meals   ondansetron  4 MG disintegrating tablet Commonly known as: ZOFRAN -ODT Take 1 tablet (4 mg total) by mouth every 6 (six) hours as needed for nausea or vomiting.   valsartan 320 MG tablet Commonly known as: DIOVAN Take 320 mg by mouth daily.        Allergies  Allergen Reactions   Aspirin     Stomach pain   Augmentin [Amoxicillin-Pot Clavulanate] Diarrhea   Compazine [Prochlorperazine Edisylate]     Pt states she does not remember    Doxycycline Nausea Only    GI UPSET   Ibuprofen      Hurts pt's stomach    Triamterene    Sulfa Antibiotics Rash    Consultations:   Procedures/Studies: US  Venous Img Lower Unilateral Left Result Date: 09/26/2023 CLINICAL DATA:  Left lower extremity swelling EXAM: Left LOWER EXTREMITY VENOUS DOPPLER ULTRASOUND TECHNIQUE: Gray-scale sonography with compression, as well as color and duplex ultrasound, were performed to evaluate the deep venous  system(s) from the level of the common femoral vein through the popliteal and proximal calf veins. COMPARISON:  Ultrasound 09/21/2023 FINDINGS: VENOUS Normal compressibility of the common femoral, superficial femoral, and popliteal veins, as well as the visualized calf veins. Visualized portions of profunda femoral vein and great saphenous vein unremarkable. No filling defects to suggest DVT on grayscale or color Doppler imaging. Doppler waveforms show normal direction of venous flow, normal respiratory plasticity and response to augmentation. Limited views of the contralateral common femoral vein are unremarkable. OTHER Subcutaneous edema in the left calf. Limitations: none IMPRESSION: Negative for acute DVT. Electronically Signed   By: Rozell Cornet M.D.   On: 09/26/2023 23:38   US  Venous Img Lower Bilateral Result Date: 09/21/2023 CLINICAL DATA:  Leg  swelling x1 week. EXAM: BILATERAL LOWER EXTREMITY VENOUS DOPPLER ULTRASOUND TECHNIQUE: Gray-scale sonography with graded compression, as well as color Doppler and duplex ultrasound were performed to evaluate the lower extremity deep venous systems from the level of the common femoral vein and including the common femoral, femoral, profunda femoral, popliteal and calf veins including the posterior tibial, peroneal and gastrocnemius veins when visible. The superficial great saphenous vein was also interrogated. Spectral Doppler was utilized to evaluate flow at rest and with distal augmentation maneuvers in the common femoral, femoral and popliteal veins. COMPARISON:  June 05, 2020 FINDINGS: RIGHT LOWER EXTREMITY Common Femoral Vein: No evidence of thrombus. Normal compressibility, respiratory phasicity and response to augmentation. Saphenofemoral Junction: No evidence of thrombus. Normal compressibility and flow on color Doppler imaging. Profunda Femoral Vein: No evidence of thrombus. Normal compressibility and flow on color Doppler imaging. Femoral Vein: No  evidence of thrombus. Normal compressibility, respiratory phasicity and response to augmentation. Popliteal Vein: No evidence of thrombus. Normal compressibility, respiratory phasicity and response to augmentation. Calf Veins: No evidence of thrombus. Normal compressibility and flow on color Doppler imaging. Superficial Great Saphenous Vein: No evidence of thrombus. Normal compressibility. Venous Reflux:  None. Other Findings: A 3.3 cm x 1.4 cm x 1.8 cm complex fluid collection is seen within the soft tissues of the right popliteal fossa. LEFT LOWER EXTREMITY Common Femoral Vein: No evidence of thrombus. Normal compressibility, respiratory phasicity and response to augmentation. Saphenofemoral Junction: No evidence of thrombus. Normal compressibility and flow on color Doppler imaging. Profunda Femoral Vein: No evidence of thrombus. Normal compressibility and flow on color Doppler imaging. Femoral Vein: No evidence of thrombus. Normal compressibility, respiratory phasicity and response to augmentation. Popliteal Vein: No evidence of thrombus. Normal compressibility, respiratory phasicity and response to augmentation. Calf Veins: No evidence of thrombus. Normal compressibility and flow on color Doppler imaging. Superficial Great Saphenous Vein: No evidence of thrombus. Normal compressibility. Venous Reflux:  None. Other Findings:  None. IMPRESSION: 1. No evidence of deep venous thrombosis in either lower extremity. 2. Complex RIGHT Baker's cyst. Electronically Signed   By: Virgle Grime M.D.   On: 09/21/2023 23:12   DG Chest 2 View Result Date: 09/21/2023 CLINICAL DATA:  Bilateral lower extremity swelling. EXAM: CHEST - 2 VIEW COMPARISON:  Chest radiograph dated 07/02/2018. FINDINGS: Patient is rotated to the left. The heart size and mediastinal contours are otherwise within normal limits. No appreciable overt edema, focal consolidation, pleural effusion, or pneumothorax. No acute osseous abnormality.  IMPRESSION: No acute cardiopulmonary findings. Electronically Signed   By: Mannie Seek M.D.   On: 09/21/2023 16:53   (Echo, Carotid, EGD, Colonoscopy, ERCP)    Subjective: Pt c/o b/l LE swelling    Discharge Exam: Vitals:   09/30/23 0416 09/30/23 0739  BP: 139/67 124/66  Pulse: (!) 59 62  Resp: 17 16  Temp: 97.7 F (36.5 C) 97.7 F (36.5 C)  SpO2: 97% 95%   Vitals:   09/29/23 1932 09/29/23 1934 09/30/23 0416 09/30/23 0739  BP: (!) 142/70 (!) 142/70 139/67 124/66  Pulse: 73 74 (!) 59 62  Resp: 19 19 17 16   Temp: (!) 97.3 F (36.3 C) (!) 97.3 F (36.3 C) 97.7 F (36.5 C) 97.7 F (36.5 C)  TempSrc:    Oral  SpO2: 96% 98% 97% 95%  Weight:      Height:        General: Pt is alert, awake, not in acute distress Cardiovascular: S1/S2 +, no rubs, no gallops Respiratory: CTA  bilaterally, no wheezing, no rhonchi Abdominal: Soft, NT, obese, bowel sounds + Extremities: edema of b/l LE, no cyanosis    The results of significant diagnostics from this hospitalization (including imaging, microbiology, ancillary and laboratory) are listed below for reference.     Microbiology: No results found for this or any previous visit (from the past 240 hours).   Labs: BNP (last 3 results) Recent Labs    04/19/23 1507 09/21/23 1606  BNP 275.8* 380.0*   Basic Metabolic Panel: Recent Labs  Lab 09/26/23 1817 09/28/23 0354 09/29/23 0546 09/30/23 0438  NA 131* 134* 133* 133*  K 5.1 4.4 4.2 4.1  CL 93* 98 101 100  CO2 28 28 29 26   GLUCOSE 108* 104* 111* 100*  BUN 21 17 19 21   CREATININE 0.77 0.59 0.59 0.47  CALCIUM  9.4 8.3* 8.2* 8.6*   Liver Function Tests: Recent Labs  Lab 09/26/23 1817  AST 26  ALT 23  ALKPHOS 73  BILITOT 0.4  PROT 7.6  ALBUMIN 3.8   No results for input(s): "LIPASE", "AMYLASE" in the last 168 hours. No results for input(s): "AMMONIA" in the last 168 hours. CBC: Recent Labs  Lab 09/26/23 1817  WBC 6.9  NEUTROABS 4.3  HGB 12.7  HCT 38.0   MCV 89.6  PLT 274   Cardiac Enzymes: No results for input(s): "CKTOTAL", "CKMB", "CKMBINDEX", "TROPONINI" in the last 168 hours. BNP: Invalid input(s): "POCBNP" CBG: No results for input(s): "GLUCAP" in the last 168 hours. D-Dimer No results for input(s): "DDIMER" in the last 72 hours. Hgb A1c No results for input(s): "HGBA1C" in the last 72 hours. Lipid Profile No results for input(s): "CHOL", "HDL", "LDLCALC", "TRIG", "CHOLHDL", "LDLDIRECT" in the last 72 hours. Thyroid function studies No results for input(s): "TSH", "T4TOTAL", "T3FREE", "THYROIDAB" in the last 72 hours.  Invalid input(s): "FREET3" Anemia work up No results for input(s): "VITAMINB12", "FOLATE", "FERRITIN", "TIBC", "IRON", "RETICCTPCT" in the last 72 hours. Urinalysis    Component Value Date/Time   COLORURINE YELLOW (A) 07/07/2023 0604   APPEARANCEUR CLEAR (A) 07/07/2023 0604   APPEARANCEUR Clear 03/02/2013 1357   LABSPEC 1.021 07/07/2023 0604   LABSPEC 1.009 03/02/2013 1357   PHURINE 5.0 07/07/2023 0604   GLUCOSEU NEGATIVE 07/07/2023 0604   GLUCOSEU Negative 03/02/2013 1357   HGBUR NEGATIVE 07/07/2023 0604   BILIRUBINUR NEGATIVE 07/07/2023 0604   BILIRUBINUR Negative 03/02/2013 1357   KETONESUR NEGATIVE 07/07/2023 0604   PROTEINUR 30 (A) 07/07/2023 0604   NITRITE NEGATIVE 07/07/2023 0604   LEUKOCYTESUR NEGATIVE 07/07/2023 0604   LEUKOCYTESUR Negative 03/02/2013 1357   Sepsis Labs Recent Labs  Lab 09/26/23 1817  WBC 6.9   Microbiology No results found for this or any previous visit (from the past 240 hours).   Time coordinating discharge: Over 30 minutes  SIGNED:   Alphonsus Jeans, MD  [facility] 09/30/2023, 10:00 AM Pager   If 7PM-7AM, please contact night-coverage [email or web address]

## 2023-10-18 ENCOUNTER — Encounter (INDEPENDENT_AMBULATORY_CARE_PROVIDER_SITE_OTHER): Payer: Self-pay

## 2023-11-15 ENCOUNTER — Emergency Department

## 2023-11-15 ENCOUNTER — Other Ambulatory Visit: Payer: Self-pay

## 2023-11-15 ENCOUNTER — Emergency Department
Admission: EM | Admit: 2023-11-15 | Discharge: 2023-11-15 | Disposition: A | Discharge status: Home / Self Care | Attending: Emergency Medicine | Admitting: Emergency Medicine

## 2023-11-15 DIAGNOSIS — R1013 Epigastric pain: Secondary | ICD-10-CM | POA: Diagnosis present

## 2023-11-15 DIAGNOSIS — I2510 Atherosclerotic heart disease of native coronary artery without angina pectoris: Secondary | ICD-10-CM | POA: Insufficient documentation

## 2023-11-15 DIAGNOSIS — R109 Unspecified abdominal pain: Secondary | ICD-10-CM

## 2023-11-15 DIAGNOSIS — J189 Pneumonia, unspecified organism: Secondary | ICD-10-CM | POA: Insufficient documentation

## 2023-11-15 DIAGNOSIS — I10 Essential (primary) hypertension: Secondary | ICD-10-CM | POA: Insufficient documentation

## 2023-11-15 LAB — CBC
HCT: 37.2 % (ref 36.0–46.0)
Hemoglobin: 12.9 g/dL (ref 12.0–15.0)
MCH: 29.6 pg (ref 26.0–34.0)
MCHC: 34.7 g/dL (ref 30.0–36.0)
MCV: 85.3 fL (ref 80.0–100.0)
Platelets: 282 10*3/uL (ref 150–400)
RBC: 4.36 MIL/uL (ref 3.87–5.11)
RDW: 12.5 % (ref 11.5–15.5)
WBC: 6.5 10*3/uL (ref 4.0–10.5)
nRBC: 0 % (ref 0.0–0.2)

## 2023-11-15 LAB — COMPREHENSIVE METABOLIC PANEL WITH GFR
ALT: 24 U/L (ref 0–44)
AST: 33 U/L (ref 15–41)
Albumin: 4 g/dL (ref 3.5–5.0)
Alkaline Phosphatase: 84 U/L (ref 38–126)
Anion gap: 11 (ref 5–15)
BUN: 17 mg/dL (ref 8–23)
CO2: 26 mmol/L (ref 22–32)
Calcium: 9 mg/dL (ref 8.9–10.3)
Chloride: 87 mmol/L — ABNORMAL LOW (ref 98–111)
Creatinine, Ser: 0.58 mg/dL (ref 0.44–1.00)
GFR, Estimated: >60 mL/min (ref >60)
Glucose, Bld: 129 mg/dL — ABNORMAL HIGH (ref 70–99)
Potassium: 4.2 mmol/L (ref 3.5–5.1)
Sodium: 124 mmol/L — ABNORMAL LOW (ref 135–145)
Total Bilirubin: 0.7 mg/dL (ref 0.0–1.2)
Total Protein: 7.6 g/dL (ref 6.5–8.1)

## 2023-11-15 LAB — URINALYSIS, ROUTINE W REFLEX MICROSCOPIC
Bilirubin Urine: NEGATIVE
Glucose, UA: NEGATIVE mg/dL
Hgb urine dipstick: NEGATIVE
Ketones, ur: NEGATIVE mg/dL
Leukocytes,Ua: NEGATIVE
Nitrite: NEGATIVE
Protein, ur: 30 mg/dL — AB
Specific Gravity, Urine: 1.008 (ref 1.005–1.030)
pH: 7 (ref 5.0–8.0)

## 2023-11-15 LAB — LIPASE, BLOOD: Lipase: 55 U/L — ABNORMAL HIGH (ref 11–51)

## 2023-11-15 LAB — TROPONIN I (HIGH SENSITIVITY): Troponin I (High Sensitivity): 6 ng/L (ref <18)

## 2023-11-15 MED ORDER — CEPHALEXIN 500 MG PO CAPS
500.0000 mg | ORAL_CAPSULE | Freq: Once | ORAL | Status: AC
  Administered 2023-11-15: 500 mg via ORAL
  Filled 2023-11-15: qty 1

## 2023-11-15 MED ORDER — CEPHALEXIN 500 MG PO CAPS: 500.0000 mg | ORAL_CAPSULE | Freq: Three times a day (TID) | ORAL | 0 refills | Status: AC

## 2023-11-15 NOTE — ED Provider Notes (Signed)
 [facility] Emergency Department Provider Note     Event Date/Time   First MD Initiated Contact with Patient 11/15/23 1534     (approximate)   History   Abdominal Pain   HPI  Kaitlyn Nelson is a 83 y.o. female with a history of HTN, GERD, hiatal hernia, CAD, BLE cellulitis, and rheumatoid arthritis, presents to the ED reporting epigastric abdominal pain with onset this morning, that is now resolved.  The patient was at a routine visit at [facility] neurology when she had that sudden fleeting abdominal pain.  Clinic noted the patient's blood pressure to be elevated systolically in the 190s.  Patient described the epigastric pain as crampy and aching in nature.  She rates the discomfort a 5 out of 10 previously.  No reported nausea, vomiting, or bowel changes.  Patient denies any sick contacts or bad food exposure.  No dysuria, retention, or hematuria is reported.  Patient would also reports an elevated blood pressure readings as of today.  She would also endorse some whitecoat syndrome noting normal blood pressure readings at home over the last several weeks.  Patient is currently on a course of clindamycin by her PCP for BLE cellulitis.  She is also endorsing a mild intermittent cough for the last few days which has been taking OTC meds.  No headache, FCS reported.    Physical Exam   Triage Vital Signs: ED Triage Vitals  Encounter Vitals Group     BP 11/15/23 1444 (!) 191/85     Girls Systolic BP Percentile --      Girls Diastolic BP Percentile --      Boys Systolic BP Percentile --      Boys Diastolic BP Percentile --      Pulse Rate 11/15/23 1444 64     Resp 11/15/23 1444 18     Temp 11/15/23 1444 98 F (36.7 C)     Temp src --      SpO2 11/15/23 1444 97 %     Weight 11/15/23 1442 157 lb (71.2 kg)     Height 11/15/23 1442 4' 9 (1.448 m)     Head Circumference --      Peak Flow --      Pain Score 11/15/23 1442 5     Pain Loc --      Pain  Education --      Exclude from Growth Chart --     Most recent vital signs: Vitals:   11/15/23 1852 11/15/23 1917  BP: (!) 168/78 (!) 165/77  Pulse: 60 64  Resp: 18 16  Temp: 98.4 F (36.9 C)   SpO2: 100% 97%    General Awake, no distress. NAD HEENT NCAT. PERRL. EOMI. No rhinorrhea. Mucous membranes are moist.  CV:  Good peripheral perfusion. RRR RESP:  Normal effort. CTA ABD:  No distention.  Soft and nontender.  Normal bowel sounds noted.  No rebound, guarding, or rigidity appreciated.  No CVA tenderness elicited.   ED Results / Procedures / Treatments   Labs (all labs ordered are listed, but only abnormal results are displayed) Labs Reviewed  LIPASE, BLOOD - Abnormal; Notable for the following components:      Result Value   Lipase 55 (*)    All other components within normal limits  COMPREHENSIVE METABOLIC PANEL WITH GFR - Abnormal; Notable for the following components:   Sodium 124 (*)    Chloride 87 (*)    Glucose, Bld 129 (*)  All other components within normal limits  URINALYSIS, ROUTINE W REFLEX MICROSCOPIC - Abnormal; Notable for the following components:   Color, Urine STRAW (*)    APPearance CLEAR (*)    Protein, ur 30 (*)    Bacteria, UA RARE (*)    All other components within normal limits  CBC  TROPONIN I (HIGH SENSITIVITY)     EKG  Vent. rate 72 BPM PR interval 204 ms QRS duration 78 ms QT/QTcB 378/413 ms P-R-T axes 66 78 50 Normal sinus rhythm Septal infarct (cited on or before 21-Sep-2023) Abnormal ECG When compared with ECG of 21-Sep-2023 16:09, No significant change was found  RADIOLOGY  I personally viewed and evaluated these images as part of my medical decision making, as well as reviewing the written report by the radiologist.  ED Provider Interpretation: Questionable atypical pneumonia versus interstitial lung disease  DG Chest 2 View Result Date: 11/15/2023 CLINICAL DATA:  cough, upper abd pain EXAM: CHEST - 2 VIEW  COMPARISON:  09/21/2023. FINDINGS: The heart size and mediastinal contours are within normal limits. There is diffuse pulmonary interstitial prominence which could be seen with atypical infection, edema or interstitial lung disease. There is no focal consolidation. No pneumothorax or pleural effusion. Aorta is calcified. There are thoracic degenerative changes. IMPRESSION: Nonspecific interstitial prominence consistent with atypical infection, edema or interstitial lung disease. No focal consolidation. Electronically Signed   By: Sydell Eva M.D.   On: 11/15/2023 16:56     PROCEDURES:  Critical Care performed: No  Procedures   MEDICATIONS ORDERED IN ED: Medications  cephALEXin  (KEFLEX ) capsule 500 mg (500 mg Oral Given 11/15/23 1851)     IMPRESSION / MDM / ASSESSMENT AND PLAN / ED COURSE  I reviewed the triage vital signs and the nursing notes.                              Differential diagnosis includes, but is not limited to, acute appendicitis, diverticulitis, urinary tract infection/pyelonephritis,  bowel obstruction, colitis, renal colic, gastroenteritis, hernia, etc.  Patient's presentation is most consistent with acute presentation with potential threat to life or bodily function.  Patient's diagnosis is consistent with abdominal cramping now resolved.  Patient endorses now some fullness or tightness in the esophagus consistent with her history of esophageal stricture.  Patient is in stable throughout her course in the ED with normal p.o. intake.  Labs are reassuring overall with no acute abnormalities appreciated.  EKG without evidence of malignant arrhythmia.  Chest x-ray reviewed interpreted by me, shows some concerning interstitial prominence that could represent atypical pneumonia versus interstitial lung disease.  Patient has been stable with a downtrending blood pressure, no evidence of hypoxia or tachycardia.  I am inclined to treat the patient empirically for atypical  pneumonia.  I will add a cephalosporin to her current course.  Patient will be discharged home with prescriptions for Keflex .  Patient is stable at this time without ongoing complaints.  She is reassured by her downtrending blood pressure, and intends to take her home BP meds when she arrives.  Patient is to follow up with her PCP and GI specialist as discussed, as needed or otherwise directed. Patient is given ED precautions to return to the ED for any worsening or new symptoms.     FINAL CLINICAL IMPRESSION(S) / ED DIAGNOSES   Final diagnoses:  Abdominal cramping  Atypical pneumonia     Rx / DC Orders  ED Discharge Orders          Ordered    cephALEXin  (KEFLEX ) 500 MG capsule  3 times daily        11/15/23 1841             Note:  This document was prepared using Dragon voice recognition software and may include unintentional dictation errors.    May Sparks, PA-C 11/15/23 2320    Twilla Galea, MD 11/18/23 [phone]

## 2023-11-15 NOTE — ED Notes (Signed)
Lab called to draw pt. blood

## 2023-11-15 NOTE — ED Triage Notes (Signed)
 Pt comes with belly pain that is crampy  and achy. Pt Bp has been elevated also. Pt states 5/10 pain. Pt states this all started this morning.

## 2023-11-15 NOTE — ED Notes (Signed)
 Pt is CAOx4, breathing normally, and normal in color. Pt is complaining of sinus congestion and well as sore throat over the past few days. Pt also reports that she had abdominal pain earlier today but that has since subsided. Pt in NAD at this time and denies any needs.

## 2023-11-15 NOTE — Discharge Instructions (Signed)
 Your exam, labs, EKG, and chest x-ray are overall reassuring.  Your blood pressure is normalizing.  The x-ray shows some concerning findings that may represent an atypical pneumonia.  You are being started on a Keflex  antibiotic to take in addition to the clindamycin currently taking.  You has reported your abdominal pain as resolved, noting instead some fullness in the throat consistent with your history of esophageal stricture.  You should follow-up with GI medicine for further evaluation.  Take your home meds as prescribed, and monitor your blood pressure as discussed.  Return to the ED if needed.

## 2023-11-16 ENCOUNTER — Other Ambulatory Visit: Payer: Self-pay | Admitting: Internal Medicine

## 2023-11-16 DIAGNOSIS — Z1231 Encounter for screening mammogram for malignant neoplasm of breast: Secondary | ICD-10-CM

## 2023-12-15 ENCOUNTER — Ambulatory Visit
Admission: RE | Admit: 2023-12-15 | Discharge: 2023-12-15 | Disposition: A | Discharge status: Home / Self Care | Source: Ambulatory Visit | Attending: Internal Medicine | Admitting: Internal Medicine

## 2023-12-15 DIAGNOSIS — Z1231 Encounter for screening mammogram for malignant neoplasm of breast: Secondary | ICD-10-CM | POA: Diagnosis present

## 2024-02-12 ENCOUNTER — Emergency Department: Admission: EM | Admit: 2024-02-12 | Discharge: 2024-02-13 | Disposition: A | Discharge status: Home / Self Care

## 2024-02-12 DIAGNOSIS — R519 Headache, unspecified: Secondary | ICD-10-CM | POA: Diagnosis present

## 2024-02-12 DIAGNOSIS — R002 Palpitations: Secondary | ICD-10-CM | POA: Insufficient documentation

## 2024-02-12 DIAGNOSIS — I159 Secondary hypertension, unspecified: Secondary | ICD-10-CM | POA: Diagnosis not present

## 2024-02-12 DIAGNOSIS — R600 Localized edema: Secondary | ICD-10-CM | POA: Diagnosis not present

## 2024-02-12 DIAGNOSIS — I10 Essential (primary) hypertension: Secondary | ICD-10-CM | POA: Diagnosis not present

## 2024-02-12 NOTE — ED Notes (Signed)
 Pt now complaining of headache, neck pain , and shoulder pain that she states she has talked to her PCP about.

## 2024-02-12 NOTE — ED Triage Notes (Addendum)
 Pt BIB EMS with complaints of hypertension. Per EMS pt's systolic BP was >200. Pt states she works at [employer] and got off 19:00 tonight. Pt states she checked her BP at home after getting off.

## 2024-02-13 ENCOUNTER — Emergency Department

## 2024-02-13 DIAGNOSIS — I159 Secondary hypertension, unspecified: Secondary | ICD-10-CM | POA: Diagnosis not present

## 2024-02-13 LAB — BASIC METABOLIC PANEL WITH GFR
Anion gap: 11 (ref 5–15)
BUN: 21 mg/dL (ref 8–23)
CO2: 25 mmol/L (ref 22–32)
Calcium: 9 mg/dL (ref 8.9–10.3)
Chloride: 92 mmol/L — ABNORMAL LOW (ref 98–111)
Creatinine, Ser: 0.66 mg/dL (ref 0.44–1.00)
GFR, Estimated: >60 mL/min (ref >60)
Glucose, Bld: 122 mg/dL — ABNORMAL HIGH (ref 70–99)
Potassium: 4 mmol/L (ref 3.5–5.1)
Sodium: 128 mmol/L — ABNORMAL LOW (ref 135–145)

## 2024-02-13 LAB — CBC WITH DIFFERENTIAL/PLATELET
Abs Immature Granulocytes: 0.02 K/uL (ref 0.00–0.07)
Basophils Absolute: 0 K/uL (ref 0.0–0.1)
Basophils Relative: 0 %
Eosinophils Absolute: 0 K/uL (ref 0.0–0.5)
Eosinophils Relative: 1 %
HCT: 38.6 % (ref 36.0–46.0)
Hemoglobin: 12.9 g/dL (ref 12.0–15.0)
Immature Granulocytes: 0 %
Lymphocytes Relative: 16 %
Lymphs Abs: 1.4 K/uL (ref 0.7–4.0)
MCH: 28.2 pg (ref 26.0–34.0)
MCHC: 33.4 g/dL (ref 30.0–36.0)
MCV: 84.5 fL (ref 80.0–100.0)
Monocytes Absolute: 0.8 K/uL (ref 0.1–1.0)
Monocytes Relative: 10 %
Neutro Abs: 6.3 K/uL (ref 1.7–7.7)
Neutrophils Relative %: 73 %
Platelets: 254 K/uL (ref 150–400)
RBC: 4.57 MIL/uL (ref 3.87–5.11)
RDW: 13.6 % (ref 11.5–15.5)
WBC: 8.6 K/uL (ref 4.0–10.5)
nRBC: 0 % (ref 0.0–0.2)

## 2024-02-13 LAB — PROTIME-INR
INR: 1 (ref 0.8–1.2)
Prothrombin Time: 13.4 s (ref 11.4–15.2)

## 2024-02-13 LAB — BRAIN NATRIURETIC PEPTIDE: B Natriuretic Peptide: 388 pg/mL — ABNORMAL HIGH (ref 0.0–100.0)

## 2024-02-13 LAB — TROPONIN I (HIGH SENSITIVITY): Troponin I (High Sensitivity): 15 ng/L (ref <18)

## 2024-02-13 MED ORDER — ACETAMINOPHEN 500 MG PO TABS
1000.0000 mg | ORAL_TABLET | Freq: Once | ORAL | Status: AC
  Administered 2024-02-13: 1000 mg via ORAL
  Filled 2024-02-13: qty 2

## 2024-02-13 MED ORDER — ACETAMINOPHEN 500 MG PO TABS: 1000.0000 mg | ORAL_TABLET | Freq: Four times a day (QID) | ORAL | 2 refills | Status: AC | PRN

## 2024-02-13 NOTE — Discharge Instructions (Signed)
 Your evaluation in the emergency department was overall reassuring.  Your blood pressure normalized without intervention, and your headache improved somewhat with Tylenol .  I recommend using Tylenol  as needed for any recurrent headache.  Please do follow-up with your primary care provider for reevaluation as well as your cardiologist to discuss your episode of palpitations today.  Return to the emergency department with any new or worsening symptoms.

## 2024-02-13 NOTE — ED Provider Notes (Addendum)
 [facility] Provider Note    Event Date/Time   First MD Initiated Contact with Patient 02/12/24 2351     (approximate)   History   Hypertension  Pt BIB EMS with complaints of hypertension. Per EMS pt's systolic BP was >200. Pt states she works at [employer] and got off 19:00 tonight. Pt states she checked her BP at home after getting off.    HPI Kaitlyn Nelson is a 83 y.o. female PMH hyperlipidemia, hypertension presents for evaluation of multiple complaints - Patient notes multiple symptoms today including severe headache this evening as well as palpitations that have been intermittent throughout the day.  Sounds as if she took her blood pressure when she got home from work around 7 PM this evening and noted systolic greater than 200 and remained elevated on multiple rechecks.  Did take all of her medications including her blood pressure medications.  Open the ambulance who brought her to the emergency department. - Currently does complain of some ongoing headache, says about 6/10 that was 9/10 previously.  Not on any blood thinners.  Denies any focal weakness. - No ongoing palpitation - Has not had any chest pain       Physical Exam   Triage Vital Signs: ED Triage Vitals  Encounter Vitals Group     BP 02/12/24 2330 (!) 169/83     Girls Systolic BP Percentile --      Girls Diastolic BP Percentile --      Boys Systolic BP Percentile --      Boys Diastolic BP Percentile --      Pulse Rate 02/12/24 2330 86     Resp 02/12/24 2330 (!) 23     Temp 02/12/24 2330 97.8 F (36.6 C)     Temp Source 02/12/24 2330 Oral     SpO2 02/12/24 2330 98 %     Weight 02/12/24 2331 156 lb 15.5 oz (71.2 kg)     Height 02/12/24 2331 4' 9 (1.448 m)     Head Circumference --      Peak Flow --      Pain Score 02/12/24 2330 8     Pain Loc --      Pain Education --      Exclude from Growth Chart --     Most recent vital signs: Vitals:   02/13/24 0030 02/13/24  0300  BP: (!) 147/71 (!) 140/57  Pulse: 78 71  Resp: 20 16  Temp:    SpO2: 97% 96%     General: Awake, no distress.  HEENT: Normocephalic, atraumatic CV:  Good peripheral perfusion. RRR, RP 2+ Resp:  Normal effort. CTAB Abd:  No distention. Nontender to deep palpation throughout Other:  Mild BLE edema, no size discrepancy, mild erythema over skin on left shin   ED Results / Procedures / Treatments   Labs (all labs ordered are listed, but only abnormal results are displayed) Labs Reviewed  BASIC METABOLIC PANEL WITH GFR - Abnormal; Notable for the following components:      Result Value   Sodium 128 (*)    Chloride 92 (*)    Glucose, Bld 122 (*)    All other components within normal limits  BRAIN NATRIURETIC PEPTIDE - Abnormal; Notable for the following components:   B Natriuretic Peptide 388.0 (*)    All other components within normal limits  CBC WITH DIFFERENTIAL/PLATELET  PROTIME-INR  TROPONIN I (HIGH SENSITIVITY)  TROPONIN I (HIGH SENSITIVITY)  EKG  Ecg = sinus rhythm, rate 85, no gross ST elevation or depression, no significant repolarization abnormality, normal axis, normal intervals.  No evidence of ischemia nor arrhythmia on my interpretation.   RADIOLOGY Radiology interpreted by myself and radiology reports reviewed.  No acute pathology identified.    PROCEDURES:  Critical Care performed: No  Procedures   MEDICATIONS ORDERED IN ED: Medications  acetaminophen  (TYLENOL ) tablet 1,000 mg (1,000 mg Oral Given 02/13/24 0134)     IMPRESSION / MDM / ASSESSMENT AND PLAN / ED COURSE  I reviewed the triage vital signs and the nursing notes.                              DDX/MDM/AP: Differential diagnosis includes, but is not limited to, intracranial hemorrhage, benign headache, no clinical concern for meningitis/encephalitis.  Suspect symptoms may be secondary to elevated blood pressure previously though fortunately blood pressure improved by time of  my eval without intervention.  With regard to palpitations, consider transient arrhythmia, ACS, anxiety, underlying anemia or electrolyte abnormality.  No clinical concern for PE at this time, heart rate 70s at time of my eval, no cardiopulmonary complaints currently.  Plan: - Labs - Chest x-ray - CT head - EKG - Reassess  Patient's presentation is most consistent with acute presentation with potential threat to life or bodily function.  The patient is on the cardiac monitor to evaluate for evidence of arrhythmia and/or significant heart rate changes.  ED course below.  Workup unremarkable, headache improved with Tylenol , blood pressure normalized without any other intervention.  CT head negative.  Cardiopulmonary workup normal, no recurrence of palpitations and remains in normal sinus rhythm here in emergency department.  No evidence of acute pathology at this time, stable for outpatient follow-up.  He is already established with a primary care doctor and cardiologist--recommend she follow-up with both of these providers.  ED return precautions in place.  Patient agrees with plan.  Clinical Course as of 02/13/24 0515  Mon Feb 13, 2024  0236 Cbc wnl [MM]  0236 CTH: IMPRESSION: No acute intracranial abnormality noted.   [MM]  0236 CXR: IMPRESSION: 1. No acute cardiopulmonary disease. Patient is slightly rotated limiting evaluation. Consider repeat PA and lateral view of the chest for further evaluation. 2.  Aortic Atherosclerosis (ICD10-I70.0).   [MM]  0300 BNP at baseline elevation [MM]  0343 BMP with improved hyponatremia, otherwise unremarkable [MM]  0510 Patient reevaluated, remains with very mild headache but no other symptoms  Reassured by unremarkable workup including CT head  Repeat bp 100s/40s --will defer any escalation of her blood pressure regimen, suspect was secondary to headache.  No evidence of acute pathology today.  Plan for PMD follow-up.  ED return  precautions in place.  Patient agrees with plan. [MM]    Clinical Course User Index [MM] Clarine Ozell LABOR, MD     FINAL CLINICAL IMPRESSION(S) / ED DIAGNOSES   Final diagnoses:  Secondary hypertension  Nonintractable headache, unspecified chronicity pattern, unspecified headache type  Palpitations     Rx / DC Orders   ED Discharge Orders          Ordered    acetaminophen  (TYLENOL ) 500 MG tablet  Every 6 hours PRN        02/13/24 0514             Note:  This document was prepared using Dragon voice recognition software and may include unintentional dictation  errors.   Clarine Ozell LABOR, MD 02/13/24 [id number]    Clarine Ozell LABOR, MD 02/13/24 [id number]

## 2024-02-13 NOTE — ED Notes (Signed)
 Pt given DC instructions. Pt verbalized understanding of follow up care. Pt taken from ED in wheelchair by this RN.
